# Patient Record
Sex: Female | Born: 1950 | Race: White | Hispanic: No | Marital: Married | Smoking: Former smoker
Health system: Southern US, Community
[De-identification: ages and names within clinical notes are randomized; demographics above are authoritative.]

## PROBLEM LIST (undated history)

## (undated) DIAGNOSIS — E7800 Pure hypercholesterolemia, unspecified: Secondary | ICD-10-CM

## (undated) DIAGNOSIS — E8881 Metabolic syndrome: Secondary | ICD-10-CM

## (undated) DIAGNOSIS — M2140 Flat foot [pes planus] (acquired), unspecified foot: Secondary | ICD-10-CM

## (undated) DIAGNOSIS — R739 Hyperglycemia, unspecified: Secondary | ICD-10-CM

## (undated) DIAGNOSIS — M25569 Pain in unspecified knee: Secondary | ICD-10-CM

## (undated) DIAGNOSIS — M1990 Unspecified osteoarthritis, unspecified site: Secondary | ICD-10-CM

## (undated) DIAGNOSIS — S83249A Other tear of medial meniscus, current injury, unspecified knee, initial encounter: Secondary | ICD-10-CM

## (undated) DIAGNOSIS — I341 Nonrheumatic mitral (valve) prolapse: Secondary | ICD-10-CM

## (undated) DIAGNOSIS — S83419A Sprain of medial collateral ligament of unspecified knee, initial encounter: Secondary | ICD-10-CM

## (undated) DIAGNOSIS — M2170 Unequal limb length (acquired), unspecified site: Secondary | ICD-10-CM

## (undated) DIAGNOSIS — T7840XA Allergy, unspecified, initial encounter: Secondary | ICD-10-CM

## (undated) DIAGNOSIS — E785 Hyperlipidemia, unspecified: Secondary | ICD-10-CM

## (undated) HISTORY — PX: NASAL SEPTUM SURGERY: SHX37

## (undated) HISTORY — DX: Hyperglycemia, unspecified: R73.9

## (undated) HISTORY — DX: Nonrheumatic mitral (valve) prolapse: I34.1

## (undated) HISTORY — PX: BREAST EXCISIONAL BIOPSY: SUR124

## (undated) HISTORY — DX: Pain in unspecified knee: M25.569

## (undated) HISTORY — DX: Pure hypercholesterolemia, unspecified: E78.00

## (undated) HISTORY — DX: Unequal limb length (acquired), unspecified site: M21.70

## (undated) HISTORY — DX: Other tear of medial meniscus, current injury, unspecified knee, initial encounter: S83.249A

## (undated) HISTORY — DX: Flat foot (pes planus) (acquired), unspecified foot: M21.40

## (undated) HISTORY — DX: Unspecified osteoarthritis, unspecified site: M19.90

## (undated) HISTORY — DX: Metabolic syndrome: E88.810

## (undated) HISTORY — DX: Allergy, unspecified, initial encounter: T78.40XA

## (undated) HISTORY — DX: Sprain of medial collateral ligament of unspecified knee, initial encounter: S83.419A

## (undated) HISTORY — PX: SINOSCOPY: SHX187

## (undated) HISTORY — DX: Hyperlipidemia, unspecified: E78.5

## (undated) HISTORY — DX: Metabolic syndrome: E88.81

---

## 1981-01-09 HISTORY — PX: BREAST BIOPSY: SHX20

## 1997-09-22 ENCOUNTER — Ambulatory Visit (HOSPITAL_COMMUNITY): Admission: RE | Admit: 1997-09-22 | Discharge: 1997-09-22 | Payer: Self-pay | Admitting: *Deleted

## 1998-04-22 ENCOUNTER — Other Ambulatory Visit: Admission: RE | Admit: 1998-04-22 | Discharge: 1998-04-22 | Payer: Self-pay | Admitting: *Deleted

## 1998-10-07 ENCOUNTER — Ambulatory Visit (HOSPITAL_COMMUNITY): Admission: RE | Admit: 1998-10-07 | Discharge: 1998-10-07 | Payer: Self-pay | Admitting: *Deleted

## 1999-04-26 ENCOUNTER — Other Ambulatory Visit: Admission: RE | Admit: 1999-04-26 | Discharge: 1999-04-26 | Payer: Self-pay | Admitting: *Deleted

## 1999-09-07 ENCOUNTER — Other Ambulatory Visit: Admission: RE | Admit: 1999-09-07 | Discharge: 1999-09-07 | Payer: Self-pay | Admitting: Obstetrics and Gynecology

## 1999-09-07 ENCOUNTER — Encounter (INDEPENDENT_AMBULATORY_CARE_PROVIDER_SITE_OTHER): Payer: Self-pay

## 1999-10-10 ENCOUNTER — Ambulatory Visit (HOSPITAL_COMMUNITY): Admission: RE | Admit: 1999-10-10 | Discharge: 1999-10-10 | Payer: Self-pay | Admitting: *Deleted

## 2000-08-10 ENCOUNTER — Encounter: Admission: RE | Admit: 2000-08-10 | Discharge: 2000-08-10 | Payer: Self-pay | Admitting: Obstetrics and Gynecology

## 2000-08-10 ENCOUNTER — Encounter: Payer: Self-pay | Admitting: Obstetrics and Gynecology

## 2000-12-03 ENCOUNTER — Ambulatory Visit (HOSPITAL_COMMUNITY): Admission: RE | Admit: 2000-12-03 | Discharge: 2000-12-03 | Payer: Self-pay | Admitting: Obstetrics and Gynecology

## 2000-12-03 ENCOUNTER — Encounter: Payer: Self-pay | Admitting: Obstetrics and Gynecology

## 2001-10-22 ENCOUNTER — Other Ambulatory Visit: Admission: RE | Admit: 2001-10-22 | Discharge: 2001-10-22 | Payer: Self-pay | Admitting: Obstetrics and Gynecology

## 2001-12-10 ENCOUNTER — Encounter: Payer: Self-pay | Admitting: Obstetrics and Gynecology

## 2001-12-10 ENCOUNTER — Encounter: Admission: RE | Admit: 2001-12-10 | Discharge: 2001-12-10 | Payer: Self-pay | Admitting: Obstetrics and Gynecology

## 2002-10-31 ENCOUNTER — Encounter: Payer: Self-pay | Admitting: Obstetrics and Gynecology

## 2002-10-31 ENCOUNTER — Encounter: Admission: RE | Admit: 2002-10-31 | Discharge: 2002-10-31 | Payer: Self-pay | Admitting: Obstetrics and Gynecology

## 2002-12-26 ENCOUNTER — Other Ambulatory Visit: Admission: RE | Admit: 2002-12-26 | Discharge: 2002-12-26 | Payer: Self-pay | Admitting: Family Medicine

## 2003-01-15 ENCOUNTER — Encounter: Admission: RE | Admit: 2003-01-15 | Discharge: 2003-01-15 | Payer: Self-pay | Admitting: Obstetrics and Gynecology

## 2003-05-06 ENCOUNTER — Encounter: Admission: RE | Admit: 2003-05-06 | Discharge: 2003-05-06 | Payer: Self-pay | Admitting: Family Medicine

## 2003-07-14 ENCOUNTER — Encounter: Admission: RE | Admit: 2003-07-14 | Discharge: 2003-07-14 | Payer: Self-pay | Admitting: Family Medicine

## 2004-02-17 ENCOUNTER — Encounter: Admission: RE | Admit: 2004-02-17 | Discharge: 2004-02-17 | Payer: Self-pay | Admitting: *Deleted

## 2004-11-24 ENCOUNTER — Encounter: Admission: RE | Admit: 2004-11-24 | Discharge: 2004-11-24 | Payer: Self-pay | Admitting: Family Medicine

## 2004-12-13 ENCOUNTER — Encounter: Admission: RE | Admit: 2004-12-13 | Discharge: 2004-12-13 | Payer: Self-pay | Admitting: Family Medicine

## 2005-01-26 ENCOUNTER — Other Ambulatory Visit: Admission: RE | Admit: 2005-01-26 | Discharge: 2005-01-26 | Payer: Self-pay | Admitting: Family Medicine

## 2005-10-17 ENCOUNTER — Encounter: Admission: RE | Admit: 2005-10-17 | Discharge: 2005-10-17 | Payer: Self-pay | Admitting: Family Medicine

## 2006-02-14 ENCOUNTER — Ambulatory Visit: Payer: Self-pay | Admitting: Sports Medicine

## 2006-02-22 ENCOUNTER — Encounter: Admission: RE | Admit: 2006-02-22 | Discharge: 2006-02-22 | Payer: Self-pay | Admitting: Family Medicine

## 2006-05-11 ENCOUNTER — Ambulatory Visit: Payer: Self-pay | Admitting: Sports Medicine

## 2006-05-11 DIAGNOSIS — S83419A Sprain of medial collateral ligament of unspecified knee, initial encounter: Secondary | ICD-10-CM | POA: Insufficient documentation

## 2006-08-15 ENCOUNTER — Encounter: Payer: Self-pay | Admitting: Sports Medicine

## 2006-10-17 ENCOUNTER — Encounter: Admission: RE | Admit: 2006-10-17 | Discharge: 2006-10-17 | Payer: Self-pay | Admitting: Family Medicine

## 2006-10-23 ENCOUNTER — Encounter: Admission: RE | Admit: 2006-10-23 | Discharge: 2006-10-23 | Payer: Self-pay | Admitting: Family Medicine

## 2007-03-26 ENCOUNTER — Encounter: Admission: RE | Admit: 2007-03-26 | Discharge: 2007-03-26 | Payer: Self-pay | Admitting: Family Medicine

## 2007-06-12 ENCOUNTER — Encounter: Admission: RE | Admit: 2007-06-12 | Discharge: 2007-06-12 | Payer: Self-pay | Admitting: Family Medicine

## 2007-07-09 ENCOUNTER — Encounter: Admission: RE | Admit: 2007-07-09 | Discharge: 2007-07-09 | Payer: Self-pay | Admitting: Family Medicine

## 2007-10-18 ENCOUNTER — Ambulatory Visit: Payer: Self-pay | Admitting: Sports Medicine

## 2007-10-18 DIAGNOSIS — IMO0002 Reserved for concepts with insufficient information to code with codable children: Secondary | ICD-10-CM | POA: Insufficient documentation

## 2007-11-05 ENCOUNTER — Ambulatory Visit: Payer: Self-pay | Admitting: Sports Medicine

## 2007-11-05 DIAGNOSIS — M2170 Unequal limb length (acquired), unspecified site: Secondary | ICD-10-CM | POA: Insufficient documentation

## 2007-12-03 ENCOUNTER — Ambulatory Visit: Payer: Self-pay | Admitting: Sports Medicine

## 2008-03-18 ENCOUNTER — Ambulatory Visit: Payer: Self-pay | Admitting: Family Medicine

## 2008-03-18 DIAGNOSIS — M79671 Pain in right foot: Secondary | ICD-10-CM | POA: Insufficient documentation

## 2008-03-23 ENCOUNTER — Ambulatory Visit: Payer: Self-pay | Admitting: Family Medicine

## 2008-07-06 ENCOUNTER — Ambulatory Visit: Payer: Self-pay | Admitting: Sports Medicine

## 2008-07-06 DIAGNOSIS — M25569 Pain in unspecified knee: Secondary | ICD-10-CM | POA: Insufficient documentation

## 2008-07-21 ENCOUNTER — Encounter: Admission: RE | Admit: 2008-07-21 | Discharge: 2008-07-21 | Payer: Self-pay | Admitting: Family Medicine

## 2008-07-24 ENCOUNTER — Encounter: Admission: RE | Admit: 2008-07-24 | Discharge: 2008-07-24 | Payer: Self-pay | Admitting: Family Medicine

## 2009-07-13 ENCOUNTER — Ambulatory Visit: Payer: Self-pay | Admitting: Family Medicine

## 2009-07-13 DIAGNOSIS — R7309 Other abnormal glucose: Secondary | ICD-10-CM | POA: Insufficient documentation

## 2009-08-16 ENCOUNTER — Ambulatory Visit: Payer: Self-pay | Admitting: Family Medicine

## 2009-08-17 DIAGNOSIS — E8881 Metabolic syndrome: Secondary | ICD-10-CM | POA: Insufficient documentation

## 2009-10-22 ENCOUNTER — Encounter: Admission: RE | Admit: 2009-10-22 | Discharge: 2009-10-22 | Payer: Self-pay | Admitting: Allergy and Immunology

## 2009-11-16 ENCOUNTER — Encounter: Admission: RE | Admit: 2009-11-16 | Discharge: 2009-11-16 | Payer: Self-pay | Admitting: Family Medicine

## 2010-01-30 ENCOUNTER — Encounter: Payer: Self-pay | Admitting: Family Medicine

## 2010-02-08 NOTE — Assessment & Plan Note (Signed)
Summary: F/U per Gerilyn Pilgrim / [facility]   Vital Signs:  Patient profile:   60 year old female Height:      63 inches Weight:      110.6 pounds BMI:     19.66  Vitals Entered By: Wyona Almas PHD (August 16, 2009 4:40 PM)  History of Present Illness: Assessment:  Spent 30 min  with pt.  Arline Asp has been surprised to see the amount of Na in her foods, and she has made changes accordingly.  She has also reduced her wine intake to  ~3-4 glasses of per week.  Although she has made much more careful food choices, she has recently been working on a stressful project at work, and has been craving sweets.  We discussed dealing with food cravings, and the need to plan ahead for meals/snacks to prevent getting over-hungry.  She is off to an excellent start.    Nutrition Diagnosis:  Progress on excessive mineral intake (sodium) (NI-55.2) related to restaurant foods as evidenced by self-report of marked reduction and of less frequent eating out.  Some progress on inappropriate intake of types of carbohydrate (NI-53.3) related to insufficient veg intake & wine as evidenced by self-report of reduced wine intake by  ~2/3 and increased frequency of veg's.    Intervention: See Patient Instructions.    Monitoring/Eval:  Per patient and Dr. Clelia Croft.      Other Orders: Reassessment Each 15 min unit- [facility] [phone])  Patient Instructions: 1)  3 Ds of dealing with a food craving (assuming you are not hungry):  2)  Delay, Distract, Distance; then Determine WHAT LED TO THIS CRAVING? AND WHAT ARE MY OPTIONS NOW?; and Determine your action plan now.   3)  PLAN AHEAD to avoid getting over-hungry.   4)  Carb's recommendation:  2 starch servings per meal and 1 per snack.  This assumes you are also getting protein and veg's with each lunch & dinner, and that you are getting a source of protein with your snack.   5)  A starch serving = 15 grams = 1 slice bread, 1/2 c of most scoopable starches, 1/2 c rice, 1 usual portion of most fruit  (1/2 of banana).   6)  Google diabetic exchange system for more info on portion sizes.

## 2010-02-08 NOTE — Assessment & Plan Note (Signed)
Summary: FU ORTHOTICS/JW   History of Present Illness: right orthotic not feeling as good and she is having some return of knee pain w walking  Physical Exam  Additional Exam:  remade right orthotic.   Impression & Recommendations:  Problem # 1:  PES PLANUS (ICD-734) remade r orthotic--no charge also gave her low back handout and knee exercises for VMO strengthening  Other Orders: No Charge Patient Arrived (NCPA0) (NCPA0)

## 2010-02-08 NOTE — Assessment & Plan Note (Signed)
Summary: 4PM APPT/FU MENISCUS AND ORTHOTICS/NM   Vital Signs:  Patient profile:   60 year old female Pulse rate:   91 / minute BP sitting:   140 / 80  Vitals Entered By: Lillia Pauls CMA (March 18, 2008 4:14 PM)   History of Present Illness: f/u left meniscus tear managed conservatively. Doing much better. walking without pain, cycling without pain. has run 2 minutes at a time on treadmill and only an occasional fleeting sharp pain, not the gravelly sensation she had before.  Sheis noticing some continued swelling above the knee but not in joint. Joint still feels a little stiff when she tries to stretch quad. No clicking, catching, locking or giving way.  Also noticed some new right  knee pain taht is occasional, laterally placed, lasts seconds, more of a twinge--cannot tell exactly what brings it on--infrequent  Also new right low back stiffness and achy type pain, esp after walking on treadmill--2.3/10, improves w rest. no radiation. points to psis area for location.  Physical Exam  General:  alert and well-developed.   Msk:  right psis  is tender to palpation and this reproduces her pain. some slight stiffness with flexion at hips and left rotation of trunk, reproducing her pain Additional Exam:  Patient had custom molded pohotics made using a neutral subtalar foot position. The orthotic was then placed on blue------ base abd trimmed accordingly. NOTE: the right oorthotic was problematic and did not have a great three point heel position--we discussed. As it was late in teh day she opted to take it home and try it---I will be happy to remake in next few days f this is uncomfortable.    Foot/Ankle Exam  Foot Exam:    GAIT reveals normal swing pphase with right foot turned outward on plant about 25 degrees.  Some over pronation of right foot. Left foot is more neutral inplat phase. FEET mild loss of transverse arch B, mild pes planus   Knee Exam  Knee Exam:    Right:  Inspection:  Abnormal    Left:    Inspection:  Abnormal    Palpation:  Normal    Stability:  stable    Swelling:  very slight fluid collection in suprapatellar puch, no true joint effusion is seen.    Erythema:  no    she can flex at knee to within 20 degrees of right leg rom. McMurray negative. Ligamentously intact   Impression & Recommendations:  Problem # 1:  MEDIAL MENISCUS TEAR, LEFT (ICD-836.0) Assessment Improved gradual return to exercise  Problem # 2:  PES PLANUS (ICD-734) orthotics 40 minutes face to face time  Appended Document: 4PM APPT/FU MENISCUS AND ORTHOTICS/NM    Clinical Lists Changes  Orders: Added new Service order of Est. Patient Level IV (14782) - Signed

## 2010-02-08 NOTE — Consult Note (Signed)
Summary: [facility]  [facility]   Imported By: Bradly Bienenstock 08/15/2006 14:31:36  _____________________________________________________________________  External Attachment:    Type:   Image     Comment:   External Document  Appended Document: [facility] Note ECHO pending.  question of MVP and should rule out LVH and HTN

## 2010-02-08 NOTE — Assessment & Plan Note (Signed)
Summary: R KNEE PAIN/NM   Vital Signs:  Patient profile:   60 year old female Height:      63 inches Weight:      110 pounds BMI:     19.56 BP sitting:   130 / 78  Vitals Entered By: Lillia Pauls CMA (July 06, 2008 10:34 AM)  History of Present Illness: Rt knee pain did get benefit from devils claw which lessened her pain a lot on left now yoga moves give "electric shock" deep in knee fleeting pain no swelling or giving way feels nodule on Rt hip area  left knee august last year hurt left knee meniscus tear found on left and this settled down with conservative care never locked no real swelling runs more now with less pain doing 1 to 1.5 miles and total of 5 in a week cycling for xtrain  Physical Exam  General:  Well-developed,well-nourished,in no acute distress; alert,appropriate and cooperative throughout examination Msk:  small nodule over RT SIJ excelletn hip ROM  RT and LT knee exam shows no effusion; stable ligaments; negative Mcmurray's and provocative meniscal tests; non painful patellar compression; patellar and quadriceps tendons unremarkable.  completely norm knee flexion pinch without pain bilat  RT leg is 1 cm shorter Extremities:  gait - walking is neutral running is neurtral and shows good form with orthotics in place no sign of leg length diff on gait   Impression & Recommendations:  Problem # 1:  KNEE PAIN, RIGHT (ICD-719.46) this does not seem like a significant injury I think she gets some radiation from RT SIJ will give her more hip rotation and dynamic hip stabilization exercises as I think this is a radiating pain  Problem # 2:  MEDIAL MENISCUS TEAR, LEFT (ICD-836.0) this is now asymptomatic  Problem # 3:  UNEQUAL LEG LENGTH (ICD-736.81) keep up use of orthotics they have given excellent gait correction  Patient Instructions: 1)  standing hip rotation 2)  cone touches 3)  keep up yoga 4)  gradually ease your running up as you wish  with goal to get to 3 to 4 miles for long run before women's only

## 2010-02-08 NOTE — Assessment & Plan Note (Signed)
Summary: LF KNEE PAIN/[facility]    Chief Complaint:  left knee pain.  History of Present Illness: left knee pain that was mild over the summer such that she was able to run throught the pain, but it worsened after an 8 mile run in early September.  At that time she had severe swelling and has developed popping and catching.  It is still very swollen.  She has decreased ROM.  TENS helps some.  She has been unable to run since the event.        Physical Exam  General:     Well-developed,well-nourished,in no acute distress; alert,appropriate and cooperative throughout examination Msk:     Knee: Inspection demonstrates a significant effusion.  No erythema. Palpation Demonstrates Medial > lateral joint line tenderness ROM normal in flexion and extension and lower leg rotation. Ligaments with solid consistent endpoints including ACL, PCL, LCL, MCL. Positive Mcmurray's with severe pain and click. Non painful patellar compression. Patellar and quadriceps tendons unremarkable. Hamstring and quadriceps strength are mildly decreased.   Ultrasound of left knee demonstrated evidence of meniscal tear with meniscal cyst and cleavage seen. Pulses:     2+ DPs Neurologic:     sensation normal distally Skin:     Intact without suspicious lesions or rashes Psych:     Cognition and judgment appear intact. Alert and cooperative with normal attention span and concentration.     Impression & Recommendations:  Problem # 1:  MEDIAL MENISCUS TEAR, LEFT (ICD-836.0) Assessment: New May be degenerative in nature, but she seems to be having mechanical symptoms and severe swelling.  Also its association with a longer run is favorable to degenerative tear even though no specific injury.  We will treat conservatively for now with straight leg raises and an ACE wrap.  She will return in 3-4 weeks.  She may need surgical referral is symptoms (especially mechanical) persist. May cross train on bike as  tolerated or try water exercises. Also advised to use a cryo cuff. three times a day  evaluation time 35 mins    ]

## 2010-02-08 NOTE — Assessment & Plan Note (Signed)
Summary: diabetes,tcb   Vital Signs:  Patient profile:   60 year old female Height:      63 inches Weight:      112.9 pounds BMI:     20.07  Vitals Entered By: Wyona Almas PHD (July 13, 2009 3:43 PM)  History of Present Illness: Assessment:  Spent 60 minutes face-to-face time with pt, who was seen by Dr. Gerilyn Pilgrim and myself.  Ms. Dearcos was referred by Dr. Philipp Deputy for pre-DM, HTN, & HLD.  Usual eating pattern includes 3 meals & 2 snacks/day.  Everyday foods/beverages include  (sometimes unsalted) nuts, herb/green tea, 1-2 c coffee w/ 2% milk & Splenda, 1-2 glasses wine, soy products, and fish (almost daily).  She travels every other week, so eats out during those times.  Is now trying to limit carb's and sugar.  Supplements include MVM, [place], astralagus, L-carnitine, 12-1498 mg Ca, niacin, 200 IU vit D, & occasional antacids & ibuprofen.  Usual exercise routine includes 10 mi running (2-3 runs/wk) and  ~1-2 hr cycling on wkends + 30 min spin 1-4 X wk.  24-hr recall suggests intake of  ~2100 kcal: B (8 AM)- almonds, 2 pc string cheese, coffee w/  ~1/4 c 2% milk; Snk (9 AM)- 3 slc [place] bacon, coffee w/  ~1/4 c 2% milk; L (1 PM)- [employer] 5-6 chx strips, unswt tea; Snk (3 PM)- almonds, unswt tea; D (7 PM)- white wine spritzer, 1 c low-carb pasta, 6 oz smkd salmon w/ artichoke, onion, dill, toss salad w/ l-f dressing; Snk (PM)- hot tea w/ 1/4 w/ 2% milk.    Nutrition Diagnosis:  Excessive mineral intake (sodium) (NI-55.2) related to restaurant foods as evidenced by yesterday's intake of several significant Na sources and diet history revealing frequent restaurant foods.  Inappropriate intake of types of carbohydrate (NI-53.3) related to insufficient veg intake & wine as evidenced by both diet history & 24-hr recall evidence of daily intake of 1-2 glasses of wine & veg's only 1 X day.    Intervention: See Patient Instructions.    Monitoring/Eval:  Dietary intake, body weight, and exercise at  2-3-wk F/U.      Impression & Recommendations:  Problem # 1:  HYPERGLYCEMIA (ICD-790.29) Spent 60 minutes with patient along with Dr. Wyona Almas counseling patient on nutrition for elevated blood sugars.   Orders: [facility]- Est  Level 4 (16109)  Patient Instructions: 1)  Request most recent labs to be faxed to Dr. Gerilyn Pilgrim:  [phone].   2)  Limit sodium intake to 1500 mg/day.  (Buy your UNsalted nuts at [facility]   3)  Increase potassium intake by eating more fruits and vegetables.   4)  Consider:  Limit alcohol intake to 1 glass 3-4 X wk.   5)  Obtain twice as many veg's as protein or carbohydrate foods for both lunch and dinner.  6)  When choosing starchy foods, loook for FIBER.   7)  The best fat choices will be canola or olive oil (monounsaturated).   8)  PLANNING IS THE KEY TO MAKING GOOD CHOICES.   9)  Make a list of 7-10 meals that meet your nutritional needs, taste good, and are quick and easy.  Use as a basis for shopping.   10)  Recommended reading:  Authors Casimiro Needle Pollen & Augustina Mood.

## 2010-02-08 NOTE — Assessment & Plan Note (Signed)
Summary: FU MENISCAL TEAR/NM   Vital Signs:  Patient Profile:   60 Years Old Female Pulse rate:   79 / minute BP sitting:   146 / 96  Vitals Entered By: Lillia Pauls CMA (December 03, 2007 1:48 PM)                 Chief Complaint:  FU L MENISCAL TEAR.  History of Present Illness: Hx of likely L knee medial meniscal tear since august, currently managed conservatively. Since last visit she notes significant improvement in her pain and swelling. She is able to walk without a limp.  She has been going to PT and working on strengthening exercises, which she is now able to do at home. She does note pain at night on days that she really pushes it. She also has noted significant improvement since starting devil's claw.  Currently doing spin class for exercise.  She denies any locking, catching, or instability.        Physical Exam  General:     Well-developed,well-nourished,in no acute distress; alert,appropriate and cooperative throughout examination Msk:     L Knee: Inspection demonstrates a trace suprapatellar effusion with NO baker's cyst.  No erythema. Palpation Demonstrates Medial > lateral joint line tenderness, improved from last visit ROM with 3-130 on L, 0-135 on R Ligaments with solid consistent endpoints including ACL, PCL, LCL, MCL.  Non painful patellar compression. Patellar and quadriceps tendons unremarkable. Hamstring and quadriceps strength are mildly decreased with minimal quadriceps atrophy.   R knee with full and painless, ROM. No swelling, weakness, instability.    Painless gait with walking and light jog Pulses:     2+ dp pulses Neurologic:     distal sensation intact    Impression & Recommendations:  Problem # 1:  MEDIAL MENISCUS TEAR, LEFT (ICD-836.0) making marked improvement start increasing training as noted in instructions if all progress tolerated will try to get back to running in 4 to 8 wks reck when she feels this is  close   Patient Instructions: 1)  begin eliptical training 2)  gradually increase walking up to 2-3 miles at a time 3)  if doing ok after 2-3 weeks, gradually begin running (1 minute walk, 1 minute run) x 2 weeks on treadmill every other day 4)  if ok and with full knee extension, will consider making adjustments to inserts and starting more aggressive running 5)  you can discontinue physical therapy 6)  ice after exercising 7)  use pain as your guide for activity 8)  f/u in 4-6 weeks   ]

## 2010-02-08 NOTE — Assessment & Plan Note (Signed)
Summary: POSTERIOR KNEE PAIN/NM   Vital Signs:  Patient Profile:   60 Years Old Female Pulse rate:   91 / minute BP sitting:   140 / 80  Vitals Entered By: Lillia Pauls CMA (November 05, 2007 11:24 AM)                 Chief Complaint:  L POST KNEE PAIN.  History of Present Illness: left knee pain that was mild over the summer such that she was able to run throught the pain, but it worsened after an 8 mile run in early September.  At that time she had severe swelling and has developed popping and catching.  Evaluated earlier this month and felt to have medial meniscal tear.  Since this visit she has noted continued posterior/medial knee pain and swelling.  Pain going up and down steps, pain with pivoting.  Occasional catching. No locking.  Ibuprofen helps mildly. She started glucosamine two days ago which has been helping.  She has avoided running and is doing some flat surface cycling. She is developing some lateral R knee pain due to favoring her L knee.  Also notes hx of leg length discrepancy, ? if this contributed to her injury.        Physical Exam  General:     Well-developed,well-nourished,in no acute distress; alert,appropriate and cooperative throughout examination Msk:     L Knee: Inspection demonstrates a trace to 1+ effusion with + baker's cyst.  No erythema. Palpation Demonstrates Medial > lateral joint line tenderness ROM with 0-120 on L, 0-135 on R Ligaments with solid consistent endpoints including ACL, PCL, LCL, MCL. R with positive Mcmurray's with severe pain and click. + bounce test. Non painful patellar compression. Patellar and quadriceps tendons unremarkable. Hamstring and quadriceps strength are mildly decreased.   R knee with full and painless, ROM. No swelling, weakness, instability.  Pain localized over ITB, but no tenderness to palpationno joint tenderness.    L leg 1 cm longer than right. full hip rom bilaterally. negative log roll  bilaterally Pulses:     2+ dp pulses Neurologic:     distal sensory and motor function intact.    Impression & Recommendations:  Problem # 1:  MEDIAL MENISCUS TEAR, LEFT (ICD-836.0) Assessment: Unchanged Discussed treatment options including conservative care vs surgical options, and likelihood of improvement with surgery (50%).  She would like to continue conservative measures at this point. Continue quad strengthening exercises. Ice as needed for pain.  Will refer to PT for quad strengthening and E-stim. She will f/u in 4-6 weeks  Problem # 2:  UNEQUAL LEG LENGTH (ICD-736.81) Assessment: New Will likely need correction with temporary inserts and/or custom orthotics, but will wait until knee pain and inflammation from meniscal tear has resolved before making any adjustments.    ]

## 2010-02-08 NOTE — Assessment & Plan Note (Signed)
Summary: ultrasound/ts   Vital Signs:  Patient Profile:   60 Years Old Female  Pt. in pain?   yes  Vitals Entered By: Arlyss Repress CMA, (May 11, 2006 2:55 PM)                Chief Complaint:  ultrasound per dr.fields.  History of Present Illness: Patient with history of nearly falling in tub and stretching rt knee into valgus position.  had earlier seen me for PFS and had done well.  since this fall she has had sharp pain along medical joint line of rt knee.  no more of the PFS pain.  knee feels less stable but no locking or giving way.  slight swelling after injury and after she did 4 to 5 mile run.  more pain in high heels or walking than with running.        Physical Exam  General:     Well-developed,well-nourished,in no acute distress; alert,appropriate and cooperative throughout examination Head:     normocephalic.   Msk:     knee exam shows no effusion; stable ligaments except pain on MCL testing that is mild  TTP along medial joint line    negative Mcmurray's and provocative meniscal tests;  non painful patellar compression;  patellar and quadriceps tendons unremarkable  Extremities:     Screening US exam did not show unstable meniscus but did show some increase motion of MCL on Rt    Impression & Recommendations:  Problem # 1:  M C L SPRAIN (ICD-844.1) Assessment: New Rt knee - use donjoy knee sleeve for activity and can try running in this if no worsening keep stress low cont rehab exercises cont temp orthotics Orders: [facility]- Est Level  3 (52841) Knee support pat cutout- [facility] (L2440)  re check 3 to 6 weeks pending response PRN

## 2010-03-14 DIAGNOSIS — J3089 Other allergic rhinitis: Secondary | ICD-10-CM | POA: Insufficient documentation

## 2010-03-14 DIAGNOSIS — E7800 Pure hypercholesterolemia, unspecified: Secondary | ICD-10-CM | POA: Insufficient documentation

## 2010-03-14 DIAGNOSIS — I059 Rheumatic mitral valve disease, unspecified: Secondary | ICD-10-CM | POA: Insufficient documentation

## 2010-03-15 ENCOUNTER — Institutional Professional Consult (permissible substitution): Payer: Self-pay | Admitting: Internal Medicine

## 2010-03-18 ENCOUNTER — Other Ambulatory Visit: Payer: Self-pay | Admitting: Pulmonary Disease

## 2010-03-18 ENCOUNTER — Telehealth (INDEPENDENT_AMBULATORY_CARE_PROVIDER_SITE_OTHER): Payer: Self-pay | Admitting: *Deleted

## 2010-03-18 ENCOUNTER — Institutional Professional Consult (permissible substitution) (INDEPENDENT_AMBULATORY_CARE_PROVIDER_SITE_OTHER): Payer: BC Managed Care – PPO | Admitting: Pulmonary Disease

## 2010-03-18 ENCOUNTER — Ambulatory Visit (INDEPENDENT_AMBULATORY_CARE_PROVIDER_SITE_OTHER)
Admission: RE | Admit: 2010-03-18 | Discharge: 2010-03-18 | Disposition: A | Discharge status: Home / Self Care | Payer: BC Managed Care – PPO | Source: Ambulatory Visit | Attending: Pulmonary Disease | Admitting: Pulmonary Disease

## 2010-03-18 ENCOUNTER — Encounter: Payer: Self-pay | Admitting: Pulmonary Disease

## 2010-03-18 DIAGNOSIS — J329 Chronic sinusitis, unspecified: Secondary | ICD-10-CM

## 2010-03-18 DIAGNOSIS — J019 Acute sinusitis, unspecified: Secondary | ICD-10-CM

## 2010-03-18 DIAGNOSIS — R059 Cough, unspecified: Secondary | ICD-10-CM

## 2010-03-18 DIAGNOSIS — E785 Hyperlipidemia, unspecified: Secondary | ICD-10-CM | POA: Insufficient documentation

## 2010-03-18 DIAGNOSIS — R05 Cough: Secondary | ICD-10-CM

## 2010-03-22 NOTE — Progress Notes (Signed)
Summary: pt wanted to know it we had ct results from today  Phone Note Call from Patient   Caller: Patient Call For: Clance Summary of Call: Patient phoned stated that she saw Dr Shelle Iron this morning and he sent her for a CT of her sinus and she was going out of town this weekend and wanted to check and see if we had the results yet. She can be reached at [phone] Initial call taken by: Vedia Coffer,  March 18, 2010 4:57 PM  Follow-up for Phone Call        pt was seen by [facility] this am and had CT of sinuses done.  Please advise of results.  results unsigned.  Aundra Millet Reynolds LPN  March 17, 9145 5:09 PM   Additional Follow-up for Phone Call Additional follow up Details #1::        let her know that she has sinusitis on ct with both acute and chronic components.  will call in abx, and stay on reflux meds as directed for next 3 weeks need to see her back in 4 weeks  call in augmentin 875mg  two times a day for 21 days #42,no fills continue neilmed sinus rinses am and pm for at least next few weeks. Additional Follow-up by: Barbaraann Share MD,  March 18, 2010 5:35 PM    Additional Follow-up for Phone Call Additional follow up Details #2::    called and spoke with pt.  informed her of ct results and kc's recs.  pt aware rx sent to pharmacy.  4 week f/u appt made for 04-13-2010 at 3:45pm.  pt verbalized understanding and denied any quesitons. Arman Filter, LPN  March 17, 8293 5:42 PM   New/Updated Medications: AUGMENTIN 875-125 MG TABS (AMOXICILLIN-POT CLAVULANATE) Take 1 tablet by mouth two times a day with food on a full stomach for 21 days Prescriptions: AUGMENTIN 875-125 MG TABS (AMOXICILLIN-POT CLAVULANATE) Take 1 tablet by mouth two times a day with food on a full stomach for 21 days  #42 x 0   Entered by:   Michel Bickers CMA   Authorized by:   Barbaraann Share MD   Signed by:   Michel Bickers CMA on 03/18/2010   Method used:   Electronically to        [facility]  (retail)       [address]       [place], [place]  [address]       Ph: [phone]       Fax: [phone]   RxID:   [id number]

## 2010-03-29 NOTE — Assessment & Plan Note (Signed)
Summary: consult for chronic cough    Visit Type:  Initial Consult Copy to:  Cam Hai Primary Provider/Referring Provider:  Cam Hai  CC:  Pulmonary Consult for cough that started approx Sept 2011.  Pt states cough is worse at night. .  History of Present Illness: the pt is a 59y/o female who I have been asked to see for chronic cough.  The cough started this past summer, and she underwent allergy testing which showed only a positive reaction to grass.  She had spirometry done which showed a decrease in her fev1 and fvc, but her ratio was totally normal.  The spirometry was also uninterpretable secondary to none of them having expiratory time of at least the required 6sec.  She had a cxr that was clear.  The pt was treated with prednisone and ultimately ICS and combo thearpy without significant benefit.  She was tried off a lot of her herbal preparations without change.  The cough is dry, and clearly worse at night when lying down.  She seldom coughs during the day.  It is not made worse with conversation.  She does have some sinus pressure and congestion, as well as postnasal drip.  She has some reflux symptoms, but not overly significant.  She denies any sob or change in her activity level currently.    Current Medications (verified): 1)  Lecithin  400mg  .... Take 1 Tablet By Mouth Once A Day 2)  Rred Yeast Rice 1200 .... Take 1 Tablet By Mouth Once A Day 3)  Flax Seed Oil 1000 Mg Caps (Flaxseed (Linseed)) .... Take 1 Tablet By Mouth Once A Day 4)  Coq10 100 Mg Caps (Coenzyme Q10) .... Take 1 Tablet By Mouth Once A Day  Allergies: 1)  ! Codeine 2)  ! Niacin 3)  ! Prednisone  Past History:  Past Medical History:  HYPERLIPIDEMIA (ICD-272.4) ALLERGIC RHINITIS (ICD-477.9) MITRAL VALVE PROLAPSE (ICD-424.0) HYPERCHOLESTEROLEMIA WITH HIGH HDL (ICD-272.0) DYSMETABOLIC SYNDROME X (ICD-277.7) HYPERGLYCEMIA (ICD-790.29) KNEE PAIN, RIGHT (ICD-719.46) PES PLANUS (ICD-734) UNEQUAL  LEG LENGTH (ICD-736.81) MEDIAL MENISCUS TEAR, LEFT (ICD-836.0) M C L SPRAIN (ICD-844.1)    Past Surgical History: Deviated septum repair  1980s L breast biopsy (benign)  1983  Family History: Reviewed history from 03/14/2010 and no changes required. Heart dz- Father (deceased age 69 with MI) Mother with HTN DM- Father Breast CA- Mother COPD- Mother rheumatism: maternal grandparent, sister  Social History: Reviewed history from 03/14/2010 and no changes required. Former smoker. started at age 59.  1 pack per week.  Quit 1986. pt is married and lives with spouse. pt has no children. pt works as a Engineer, civil (consulting).  ETOH- wine every day.  Review of Systems       The patient complains of non-productive cough, acid heartburn, nasal congestion/difficulty breathing through nose, and sneezing.  The patient denies shortness of breath with activity, shortness of breath at rest, productive cough, coughing up blood, chest pain, irregular heartbeats, indigestion, loss of appetite, weight change, abdominal pain, difficulty swallowing, sore throat, tooth/dental problems, headaches, itching, ear ache, anxiety, depression, hand/feet swelling, joint stiffness or pain, rash, change in color of mucus, and fever.    Vital Signs:  Patient profile:   60 year old female Height:      63 inches Weight:      110.38 pounds BMI:     19.62 O2 Sat:      99 % on Room air Temp:     97.4 degrees F oral Pulse rate:  121 / minute BP sitting:   118 / 78  (left arm) Cuff size:   regular  Vitals Entered By: Arman Filter LPN (March 17, 1608 9:02 AM)  O2 Flow:  Room air CC: Pulmonary Consult for cough that started approx Sept 2011.  Pt states cough is worse at night.  Comments Medications reviewed with patient Arman Filter LPN  March 18, 9602 9:02 AM    Physical Exam  General:  thin female in nad  Eyes:  PERRLA and EOMI.   Nose:  narrowed bilat, mild erythema, no purulence Mouth:  clear, no  lesions or exudates.  Neck:  no jvd, tmg, LN Lungs:  totally clear to auscultation  Heart:  rrr, no mrg Abdomen:  soft and nontender, bs+ Extremities:  no edema or cyanosis  pulses intact distally Neurologic:  alert, oriented, moves all 4    Impression & Recommendations:  Problem # 1:  COUGH (ICD-786.2) the pt's cough sounds more upper airway in origin than anything else.  I suspect this is not due to cough variant asthma, especially with her lack of response to excellent asthma therapy.  Her history is suspicious for chronic sinus disease, and possibly a component of reflux.  I would like to check a scan of her sinuses, and also treat her emperically for reflux.  If the scan shows disease, will need a prolonged course of abx.    Medications Added to Medication List This Visit: 1)  Lecithin 400mg   .... Take 1 tablet by mouth once a day 2)  Rred Yeast Rice 1200  .... Take 1 tablet by mouth once a day 3)  Flax Seed Oil 1000 Mg Caps (Flaxseed (linseed)) .... Take 1 tablet by mouth once a day 4)  Coq10 100 Mg Caps (Coenzyme q10) .... Take 1 tablet by mouth once a day 5)  Omeprazole 20 Mg Cpdr (Omeprazole) .... One by mouth in am and pm. take one half hour before eating.  Other Orders: Consultation Level V [phone]) Radiology Referral (Radiology)  Patient Instructions: 1)  will check scan of sinuses looking for chronic sinus disease 2)  trial of omeprazole 20mg  one in am and pm for next 3 weeks. 3)  stop fish oil for now 4)  will call you with results of sinus xray.  Prescriptions: OMEPRAZOLE 20 MG  CPDR (OMEPRAZOLE) one by mouth in am and pm. Take one half hour before eating.  #50 x 0   Entered and Authorized by:   Barbaraann Share MD   Signed by:   Barbaraann Share MD on 03/18/2010   Method used:   Print then Give to Patient   RxID:   [id number]

## 2010-04-01 ENCOUNTER — Telehealth: Payer: Self-pay | Admitting: Pulmonary Disease

## 2010-04-01 NOTE — Telephone Encounter (Signed)
Pt read an article that if you are on an abx for more than 5-7 days then you can develop a resistance to certain abx's. Called and spoke with pt and she states she wanted to know why she needed to continue her abx augmentin for another week since she has already been on it for 2 weeks. Per [facility] bc she has chronic sinusitis. Pt verbalized udnerstanding  Carver Fila, CMA

## 2010-04-01 NOTE — Telephone Encounter (Signed)
[email or web address].Jennifer Castillo, CMA  

## 2010-04-01 NOTE — Telephone Encounter (Signed)
LMTCB

## 2010-04-01 NOTE — Telephone Encounter (Signed)
Spoke with pt.  She is concerned about taking omeprazole for 3 wks. She read today that taking this med for more than 14 days can cause resistance to abx.  She states that med has helped and she is feeling much better so wants to know if Kristin Fleming wants her to continue to take this.  Please advise thanks

## 2010-04-01 NOTE — Telephone Encounter (Signed)
Yes, she is to continue this, and not an issue to take longterm as advertised.

## 2010-04-12 ENCOUNTER — Encounter: Payer: Self-pay | Admitting: Pulmonary Disease

## 2010-04-13 ENCOUNTER — Ambulatory Visit: Payer: BC Managed Care – PPO | Admitting: Pulmonary Disease

## 2010-04-14 ENCOUNTER — Ambulatory Visit (INDEPENDENT_AMBULATORY_CARE_PROVIDER_SITE_OTHER): Payer: BC Managed Care – PPO | Admitting: Pulmonary Disease

## 2010-04-14 ENCOUNTER — Encounter: Payer: Self-pay | Admitting: Pulmonary Disease

## 2010-04-14 VITALS — BP 140/82 | HR 82 | Temp 97.5°F | Ht 63.0 in | Wt 111.2 lb

## 2010-04-14 DIAGNOSIS — J019 Acute sinusitis, unspecified: Secondary | ICD-10-CM

## 2010-04-14 DIAGNOSIS — R059 Cough, unspecified: Secondary | ICD-10-CM

## 2010-04-14 DIAGNOSIS — R05 Cough: Secondary | ICD-10-CM

## 2010-04-14 NOTE — Progress Notes (Signed)
  Subjective:    Patient ID: Kristin Fleming, female    DOB: 07-13-1950, 60 y.o.   MRN: [id number]  HPI The pt comes in today for f/u of her chronic cough.  At the last visit, her cough was felt to be more upper airway than lower, and a ct sinuses showed acute and chronic sinusitis.  She was treated with 3 weeks of abx, neilmed sinus rinses, and emperic treatment with PPI for possible LPR.  She comes in today where her cough is at least 40-50% better, and she feels it is more of a "nuisance" now than anything else.  She still has some postnasal drip, but is trying to avoid antihistamines.  She does not feel reflux is an issue to her cough at this point.  Her sinus pressure has resolved, and has not seen purulence from nares.    Review of Systems  Constitutional: Negative for fever and unexpected weight change.  HENT: Positive for congestion, rhinorrhea, sneezing and postnasal drip. Negative for ear pain, nosebleeds, sore throat, trouble swallowing, dental problem and sinus pressure.   Eyes: Positive for redness and itching.  Respiratory: Positive for cough. Negative for chest tightness, shortness of breath and wheezing.   Cardiovascular: Negative for palpitations and leg swelling.  Gastrointestinal: Positive for diarrhea. Negative for nausea and vomiting.  Genitourinary: Negative for dysuria.  Musculoskeletal: Negative for joint swelling.  Skin: Negative for rash.  Neurological: Negative for headaches.  Hematological: Does not bruise/bleed easily.  Psychiatric/Behavioral: Negative for dysphoric mood. The patient is not nervous/anxious.        Objective:   Physical Exam Thin female in nad Nares patent without discharge No edema or cyanosis  Alert and oriented, moves all 4 without obvious deficit.        Assessment & Plan:

## 2010-04-14 NOTE — Patient Instructions (Signed)
Ok to stop omeprazole, but if cough worsens, this may be an issue Continue nasal rinses for a little while longer Can try zyrtec or chlorpheniramine for post nasal drip Can try nasonex 2 sprays each nostril once a day for 2 weeks. (samples given) followup with me as needed.  Let me know if cough worsens

## 2010-04-16 ENCOUNTER — Encounter: Payer: Self-pay | Admitting: Pulmonary Disease

## 2010-04-16 DIAGNOSIS — J019 Acute sinusitis, unspecified: Secondary | ICD-10-CM | POA: Insufficient documentation

## 2010-04-16 NOTE — Assessment & Plan Note (Addendum)
The pt's cough is at least 40-50% better since the last visit, and continues to sound upper airway in origin.  She is still having postnasal drip, but does not feel the PPI has made a signficant difference.  I have asked her to continue with her nasal hygiene regimen, and also to try otc antihistamine.  I have also given her samples of nasonex to try for her postnasal drip.  I have given her the option of conservative treatment over a little more time vs doing further w/u with ENT evaluation, spirometry, etc.  She would like to see how she does over time before doing further w/u.

## 2010-04-16 NOTE — Assessment & Plan Note (Signed)
The pt has been treated with a prolonged course of augmentin, and is much improved.  It is unclear whether this may be an ongoing issue for her.  Will follow clinically for now.

## 2010-05-09 ENCOUNTER — Encounter: Payer: Self-pay | Admitting: Pulmonary Disease

## 2010-08-12 ENCOUNTER — Ambulatory Visit: Payer: BC Managed Care – PPO | Admitting: Pulmonary Disease

## 2010-08-22 ENCOUNTER — Encounter: Payer: Self-pay | Admitting: Pulmonary Disease

## 2010-08-22 ENCOUNTER — Ambulatory Visit (INDEPENDENT_AMBULATORY_CARE_PROVIDER_SITE_OTHER): Payer: BC Managed Care – PPO | Admitting: Pulmonary Disease

## 2010-08-22 VITALS — BP 144/86 | HR 73 | Temp 97.4°F | Ht 63.0 in | Wt 113.4 lb

## 2010-08-22 DIAGNOSIS — R053 Chronic cough: Secondary | ICD-10-CM

## 2010-08-22 DIAGNOSIS — R05 Cough: Secondary | ICD-10-CM

## 2010-08-22 DIAGNOSIS — R059 Cough, unspecified: Secondary | ICD-10-CM

## 2010-08-22 NOTE — Patient Instructions (Signed)
Try Qnasal 2 puffs each nostril each am after rinsing sinuses.  Do not sniff, let inhaler do work Take zyrtec 10mg  at bedtime Do the above for the next 3 weeks, and let me know of your progress.

## 2010-08-22 NOTE — Progress Notes (Signed)
  Subjective:    Patient ID: Kristin Fleming, female    DOB: [birth year], 60 y.o.   MRN: [id number]  HPI The patient comes in today for evaluation of cough.  I had seen her in the past, and she was found to have acute and possibly chronic sinusitis by CT scan.  She was treated with antibiotics and a nasal hygiene regimen with significant improvement in her symptoms.  Her cough did not resolve 100%, but nearly so.  She is now having a slight increase in her cough and is concerned that it will escalate again.  She is worried about what is causing it and whether this may impact her long term.  She has never had a valid spirometry, but really has no history to support the diagnosis of asthma.  The plan was to do this if her cough returned.  She continues to have some nasal symptoms and postnasal drip.  She is also felt to have a mild cyclical cough mechanism as well.  She denies any overt symptoms of GERD at this time.   Review of Systems  Constitutional: Negative for fever and unexpected weight change.  HENT: Positive for congestion, rhinorrhea and postnasal drip. Negative for ear pain, nosebleeds, sore throat, sneezing, trouble swallowing, dental problem and sinus pressure.   Eyes: Positive for redness and itching.  Respiratory: Positive for cough. Negative for chest tightness, shortness of breath and wheezing.   Cardiovascular: Negative for palpitations and leg swelling.  Gastrointestinal: Negative for nausea and vomiting.  Genitourinary: Negative for dysuria.  Musculoskeletal: Negative for joint swelling.  Skin: Negative for rash.  Neurological: Negative for headaches.  Hematological: Bruises/bleeds easily.  Psychiatric/Behavioral: Negative for dysphoric mood. The patient is not nervous/anxious.        Objective:   Physical Exam Thin female in no acute distress Nose patent without discharge or purulence Oropharynx clear Chest is clear to auscultation Cardiac with regular rate and  rhythm Lower extremities without edema, no cyanosis noted Alert and oriented, moves all 4 extremities.       Assessment & Plan:

## 2010-08-23 NOTE — Assessment & Plan Note (Signed)
The patient has a history of chronic cough, but was found to have changes of acute and chronic sinusitis on her CT scan.  She was treated aggressively for this, and had significant improvement in her cough.  Her cough is now a little bit more prominent, and she is concerned about it worsening.  Her spirometry today is normal by her air flow numbers, but the flow volume loop suggest occult obstruction.  However, the patient has been treated with prednisone, inhaled corticosteroids, and a LABA/ICS prior to seeing me originally without a change in her symptoms.  Therefore, this is very unlikely to be asthma.  I really think a lot of her problems are related to rhinitis with an element of chronic sinusitis.  The patient is very hesitant to stay on any maintenance medications over a period of time, and I have told her that she is going to have to make the quality of life decision of living with her cough versus trying a maintenance regimen to see if things improve.  I would recommend a nasal corticosteroid, sinus rinses, as well as a daily antihistamine for a period of time to see if things improve.  She can then make the decision whether this agrees with her lifestyle.  If she continues to have issues with a cough despite the above, I would reimage her sinuses and refer to ENT if persistently abnormal.

## 2010-09-08 ENCOUNTER — Other Ambulatory Visit (HOSPITAL_COMMUNITY)
Admission: RE | Admit: 2010-09-08 | Discharge: 2010-09-08 | Disposition: A | Discharge status: Home / Self Care | Payer: BC Managed Care – PPO | Source: Ambulatory Visit | Attending: Family Medicine | Admitting: Family Medicine

## 2010-09-08 ENCOUNTER — Other Ambulatory Visit: Payer: Self-pay | Admitting: Family Medicine

## 2010-09-08 DIAGNOSIS — Z01419 Encounter for gynecological examination (general) (routine) without abnormal findings: Secondary | ICD-10-CM | POA: Insufficient documentation

## 2011-02-10 ENCOUNTER — Other Ambulatory Visit: Payer: Self-pay | Admitting: *Deleted

## 2011-02-10 ENCOUNTER — Other Ambulatory Visit: Payer: Self-pay | Admitting: Family Medicine

## 2011-02-10 DIAGNOSIS — Z1231 Encounter for screening mammogram for malignant neoplasm of breast: Secondary | ICD-10-CM

## 2011-02-13 ENCOUNTER — Ambulatory Visit (INDEPENDENT_AMBULATORY_CARE_PROVIDER_SITE_OTHER): Payer: BC Managed Care – PPO | Admitting: Sports Medicine

## 2011-02-13 VITALS — BP 140/80

## 2011-02-13 DIAGNOSIS — M2170 Unequal limb length (acquired), unspecified site: Secondary | ICD-10-CM

## 2011-02-13 DIAGNOSIS — M23305 Other meniscus derangements, unspecified medial meniscus, unspecified knee: Secondary | ICD-10-CM

## 2011-02-13 DIAGNOSIS — IMO0002 Reserved for concepts with insufficient information to code with codable children: Secondary | ICD-10-CM

## 2011-02-13 MED ORDER — KETOPROFEN POWD: Status: DC

## 2011-02-13 NOTE — Assessment & Plan Note (Signed)
Exam of the left knee is completely unremarkable today and she shows no signs of continued meniscus injury.

## 2011-02-13 NOTE — Patient Instructions (Addendum)
Do stretch with both knees to chest, then stretch both knees to each side   Do pretzel stretch on both sides  Standing hip rotations  Please follow up in 6 weeks for new orthotics  Thank you for seeing [place] today!

## 2011-02-13 NOTE — Assessment & Plan Note (Addendum)
Leg lengths equal today- 81 cm bilat Upward shift of right hemipelvis causes pseudo leg length discrepency Gave patient knee to chest and pretzel stretches, and standing hip rotations to help reset pelvis Adjusted patient's current orthotics- shaved down previous leg length corrections Patient to return for follow up in 6 weeks, and we will make her a new pair of orthotics at that time.   Her gait looks very neutral. This is after we adjusted the orthotics to take off the leg length correction and we repositioned her pelvis by doing some manipulations.

## 2011-02-13 NOTE — Progress Notes (Signed)
  Subjective:    Patient ID: Kristin Fleming, female    DOB: [birth year], 61 y.o.   MRN: [id number]  HPI   Pt presents to clinic for orthotics adjustment. States she went running in a new pair of running shoes without orthotics, and had less rt SI joint pain that she usually does with her orthotics. Hx of rt SI joint pain since falling down a flight of stairs and fx coccyx 8 years ago. Pt runs 10 miles per week.  Orthotics originally helped but she is wondering if she needs some different adjustment or if there is some problem with these She has some moderate forefoot collapse on the right with some early bunion changes that have been stabilized by the orthotics. She also had knee pain and a torn meniscus on the right that has now resolved.  She comes for our assessment.     Review of Systems     Objective:   Physical Exam  Legs 81 cm bilat Rt leg shortens with lying down when pelvis shifts upwards Posterior shift of PSIS on rt, sitting higher and more posterior than lt Nml hip ROM bilat Standing - rt pelvic forward rotation  Rt foot - more forefoot break- forefoot widening Slight breakdown of long arch on rt Slight hammering of 4th toe on rt Good great toe motion on rt Good post tib function  No scoliosis is noted        Assessment & Plan:

## 2011-03-09 ENCOUNTER — Ambulatory Visit
Admission: RE | Admit: 2011-03-09 | Discharge: 2011-03-09 | Disposition: A | Discharge status: Home / Self Care | Payer: BC Managed Care – PPO | Source: Ambulatory Visit | Attending: Family Medicine | Admitting: Family Medicine

## 2011-03-09 DIAGNOSIS — Z1231 Encounter for screening mammogram for malignant neoplasm of breast: Secondary | ICD-10-CM

## 2011-03-27 ENCOUNTER — Ambulatory Visit (INDEPENDENT_AMBULATORY_CARE_PROVIDER_SITE_OTHER): Payer: BC Managed Care – PPO | Admitting: Sports Medicine

## 2011-03-27 VITALS — BP 134/80

## 2011-03-27 DIAGNOSIS — M79671 Pain in right foot: Secondary | ICD-10-CM

## 2011-03-27 DIAGNOSIS — M79609 Pain in unspecified limb: Secondary | ICD-10-CM

## 2011-03-27 DIAGNOSIS — M25569 Pain in unspecified knee: Secondary | ICD-10-CM

## 2011-03-27 DIAGNOSIS — M2170 Unequal limb length (acquired), unspecified site: Secondary | ICD-10-CM

## 2011-03-27 NOTE — Assessment & Plan Note (Signed)
Correction added to RT orthotic  This does improve gait

## 2011-03-27 NOTE — Assessment & Plan Note (Signed)
With degenerative meniscus I think she needs new orthotics for better support and to unload medial aspect of knee

## 2011-03-27 NOTE — Assessment & Plan Note (Signed)
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 The orthotic was heated and afterward the patient stood on the orthotic blank positioned on the orthotic stand. The patient was positioned in subtalar neutral position and 10 degrees of ankle dorsiflexion in a weight bearing stance. After completion of molding, a stable base was applied to the orthotic blank. The blank was ground to a stable position for weight bearing. Size: 7 red cambray Base: blue eva med density Posting: RT blue foam padding 1/2 cm Additional orthotic padding:  Time 45 mins  Gait neutral and no pain on walking or running P orthotics complete

## 2011-03-27 NOTE — Progress Notes (Signed)
  Subjective:    Patient ID: Kristin Fleming, female    DOB: 1950/07/09, 61 y.o.   MRN: [id number]  HPI Patient returns for evaluation We modified her orthotics on last visit Plan to make new ones on this visit if that helped foot pain She felt less foot pain However older orthotics cause some RT knee pain with running  No swelling, giving way or locking of RT knee now   Review of Systems     Objective:   Physical Exam NAD  RT Knee: Normal to inspection with no erythema or effusion or obvious bony abnormalities. Palpation normal with no warmth or joint line tenderness or patellar tenderness or condyle tenderness. ROM normal in flexion and extension and lower leg rotation. Ligaments with solid consistent endpoints including ACL, PCL, LCL, MCL. Negative Mcmurray's and provocative meniscal tests. Non painful patellar compression. Patellar and quadriceps tendons unremarkable. Hamstring and quadriceps strength is normal.  TMT joint DJD at 2nd TMT RT and some bossing on left Widening of RT forefoot Hallux rigidus with some DJD chnages at MTP1/ less on left Abnormal calluses Standing she looses long arch and transverse arch on RT, long arch left Leg length is 1 cm less on RT       Assessment & Plan:

## 2011-05-01 ENCOUNTER — Ambulatory Visit (INDEPENDENT_AMBULATORY_CARE_PROVIDER_SITE_OTHER): Payer: BC Managed Care – PPO | Admitting: Sports Medicine

## 2011-05-01 VITALS — BP 136/79

## 2011-05-01 DIAGNOSIS — M533 Sacrococcygeal disorders, not elsewhere classified: Secondary | ICD-10-CM

## 2011-05-01 DIAGNOSIS — M2170 Unequal limb length (acquired), unspecified site: Secondary | ICD-10-CM

## 2011-05-01 NOTE — Patient Instructions (Signed)
1. You can try stretching when you have SI joint pain in your back.   The pretzel stretch is lying on your side with the top leg off of the bed.  Stretch your leg down towards the floor with an opposing stretch on your shoulder.  The coccyx stretch.  Put a small book in the mid lower back where your coccyx and try to push down on either side of your hips.  Basic pelvic tilts help also.  2. Follow up with [place] in July.  We will have you see Dr. Katrinka Blazing at that time.

## 2011-05-03 DIAGNOSIS — M533 Sacrococcygeal disorders, not elsewhere classified: Secondary | ICD-10-CM | POA: Insufficient documentation

## 2011-05-03 NOTE — Assessment & Plan Note (Signed)
She is doing well with her orthotics.  Follow up PRN

## 2011-05-03 NOTE — Progress Notes (Signed)
  Subjective:    Patient ID: Kristin Fleming, female    DOB: 03/17/1950, 61 y.o.   MRN: [id number]  HPI 61 y/o female is here for her one month follow up after getting orthotics.  The orthotics are working well for her.  She has no foot pain or blisters.  They fit well in her shoes.  The knee pain she had previously with running is resolved. She is running about 10 miles per week.    On her last visit she had some manipulation for her SI dysfunction.  She is not having any pain today but would like more manipulation.   Review of Systems     Objective:   Physical Exam  Lumbar spine has normal ROM and is non-tender SI joints are non-tender, joint mice r>l No pain with FABER but right is tighter than the left Both SI joints are mobile but left moves less than right with pelvic rocking  Pretzel stretch and pelvic rocking performed in clinic to loosen the SI joints      Assessment & Plan:

## 2011-05-03 NOTE — Assessment & Plan Note (Addendum)
Taught patient 3 exercises that she can do to self treat at home PRN.  She will follow up in July for OMT.

## 2012-04-30 ENCOUNTER — Ambulatory Visit (INDEPENDENT_AMBULATORY_CARE_PROVIDER_SITE_OTHER): Payer: 59 | Admitting: Sports Medicine

## 2012-04-30 ENCOUNTER — Encounter: Payer: Self-pay | Admitting: Sports Medicine

## 2012-04-30 VITALS — BP 160/84 | HR 101 | Ht 63.0 in | Wt 113.0 lb

## 2012-04-30 DIAGNOSIS — M7740 Metatarsalgia, unspecified foot: Secondary | ICD-10-CM | POA: Insufficient documentation

## 2012-04-30 DIAGNOSIS — M7750 Other enthesopathy of unspecified foot: Secondary | ICD-10-CM

## 2012-04-30 NOTE — Assessment & Plan Note (Signed)
I placed a metatarsal pad on the right and left orthotic Her walking gait was comfortable after these were placed  We also added these to a pair of regular shoes  She should try this for one month  If this strategy works we want to have her continue to use metatarsal pads in all shoes

## 2012-04-30 NOTE — Progress Notes (Signed)
  Subjective:    Patient ID: VERTA RIEDLINGER, female    DOB: May 22, 1950, 62 y.o.   MRN: [id number]  HPI Patient with have had an orthotics for some time. She generally has done better from the pain and from other running related injury since using the orthotics. Recently however she has had more forefoot pain. This has been primarily in the right foot. She sees a change in the shape of her foot in the orthotics are not as comfortable. She comes for evaluation of this.  This doesn't not seem to be made worse by running and she is only running about 6 miles per week.  She does notice that it is worse in certain shoes    Review of Systems     Objective:   Physical Exam No acute distress  Right foot shows increasing collapse of the transverse arch The right foot is now 3/4 cm wider than the left at the forefoot The fourth metatarsal head is drop on the right She has some early hammertoe changes on toes 4 and 5 She has no knuckle pads on toes 2 and 4 Mild loss of the longitudinal arch  Left foot reveals very mild drop of the transverse arch The first metatarsal head is somewhat down palpation non-painful       Assessment & Plan:

## 2012-05-30 ENCOUNTER — Encounter: Payer: Self-pay | Admitting: Sports Medicine

## 2012-05-30 ENCOUNTER — Ambulatory Visit (INDEPENDENT_AMBULATORY_CARE_PROVIDER_SITE_OTHER): Payer: 59 | Admitting: Sports Medicine

## 2012-05-30 VITALS — BP 162/102 | HR 99 | Ht 63.0 in | Wt 113.0 lb

## 2012-05-30 DIAGNOSIS — M7740 Metatarsalgia, unspecified foot: Secondary | ICD-10-CM

## 2012-05-30 DIAGNOSIS — M7741 Metatarsalgia, right foot: Secondary | ICD-10-CM

## 2012-05-30 DIAGNOSIS — M7742 Metatarsalgia, left foot: Secondary | ICD-10-CM

## 2012-05-30 DIAGNOSIS — M7750 Other enthesopathy of unspecified foot: Secondary | ICD-10-CM

## 2012-05-30 NOTE — Progress Notes (Signed)
  Subjective:    Patient ID: Kristin Fleming, female    DOB: Oct 31, 1950, 62 y.o.   MRN: [id number]  HPI  Pt presents to clinic for f/u of bilat metatarsalgia- R>L. She has been wearing MT pads in most pairs of shoes, and this has been very helpful. Rt great toe joint pain has much improved.  Running in custom orthotics with MT pads, without pain.  Comes to see if MT pads can be placed in regular shoes  Med Hx High cholesterol - not on meds/ HDL good  Fam HX that father died at 52 massive MI/ Mother had 4V bypass/ she continues to run but no chest pain/  However periodic BP elevation even with HCTZ 12.5 qod    Review of Systems     Objective:   Physical Exam  NAD Blood pressure 160/102 and it has been elevated on prior visits  Rt foot: 4th MT down Callusing smaller  1st and 2nd TMT bossing Small corn on DIP 2nd toe Callusing on dorsum of toes 2-4   Lt foot: Transverse arch dropped but flexible No TMT bossing Excellent great toe motion          Assessment & Plan:    Increased cardiac risk  While she is an active person she has essentially 3 risk factors of hyperlipidemia, hypertension and a positive family history of coronary artery disease in both parents. Since she runs distance and pushes herself fairly hard I think she would be smart to do an exercise tolerance test. I will schedule her for this and send a note to her physician Dr. Clelia Croft to be sure that this fits with her long-term management.

## 2012-05-30 NOTE — Assessment & Plan Note (Signed)
She was doing very well from the standpoint of controlling pain with the use metatarsal pads  We added these today she is today and that helped  She was given toe spacers for the right foot because she's getting a corn that was putting pressure on her right great toe  She was given a prescription to order more pads in place them in her other pairs of shoes

## 2012-12-10 ENCOUNTER — Encounter: Payer: Self-pay | Admitting: Family Medicine

## 2012-12-10 ENCOUNTER — Ambulatory Visit (INDEPENDENT_AMBULATORY_CARE_PROVIDER_SITE_OTHER): Payer: 59 | Admitting: Family Medicine

## 2012-12-10 VITALS — BP 159/99 | HR 87 | Ht 63.0 in | Wt 113.0 lb

## 2012-12-10 DIAGNOSIS — S93409A Sprain of unspecified ligament of unspecified ankle, initial encounter: Secondary | ICD-10-CM

## 2012-12-10 NOTE — Patient Instructions (Signed)
Thank you for coming in today  I did not see any problems with your peroneal tendon on your ultrasound I suspect this is all ligament or soft tissue injury Get an ankle brace and wear during exercise  Start ankle rehab exercises - eccentric calf raise 3 x 15 - calf raise with toes turned out 3 x 15 - single leg balance 3 x 30 sec with increasing difficulty - single leg balance clock reach. 15 reps in each direction  Ice after exercise and 2 x per day Aleve 2 tablets 2 x per day for 7 days with food Okay to start jogging. Walk 4 min and jog 1 min, then walk 3 min and jog 1 min and progressively increase
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
## 2012-12-21 ENCOUNTER — Encounter: Payer: Self-pay | Admitting: Internal Medicine

## 2013-02-10 ENCOUNTER — Encounter: Payer: Self-pay | Admitting: Cardiology

## 2013-05-27 ENCOUNTER — Encounter: Payer: Self-pay | Admitting: Internal Medicine

## 2013-05-28 ENCOUNTER — Telehealth: Payer: Self-pay | Admitting: *Deleted

## 2013-05-28 NOTE — Telephone Encounter (Signed)
Message copied by Daphine DeutscherMILLER, REGINA N on Wed May 28, 2013  3:13 PM ------      Message from: Hart CarwinBRODIE, DORA M      Created: Wed May 28, 2013  1:08 PM      Regarding: colonoscopy       Rene KocherRegina, this pt will be calling from Dr Kirtland BouchardK.Shaw's office. She will be setting up a colonoscopy.Marland Kitchen. She wants on on a Friday.You may contact her about it. ------

## 2013-05-28 NOTE — Telephone Encounter (Signed)
Left a message for patient to call me. 

## 2013-06-03 NOTE — Telephone Encounter (Signed)
Patient has scheduled procedure already.

## 2013-07-09 ENCOUNTER — Ambulatory Visit (AMBULATORY_SURGERY_CENTER): Payer: Self-pay | Admitting: *Deleted

## 2013-07-09 VITALS — Ht 63.0 in | Wt 113.2 lb

## 2013-07-09 DIAGNOSIS — Z1211 Encounter for screening for malignant neoplasm of colon: Secondary | ICD-10-CM

## 2013-07-09 MED ORDER — MOVIPREP 100 G PO SOLR: ORAL | Status: DC

## 2013-07-09 NOTE — Progress Notes (Signed)
No allergies to eggs or soy. No problems with anesthesia.  Pt given Emmi instructions for colonoscopy  No oxygen use  No diet drug use  

## 2013-07-10 ENCOUNTER — Encounter: Payer: Self-pay | Admitting: Internal Medicine

## 2013-07-15 ENCOUNTER — Telehealth: Payer: Self-pay | Admitting: Internal Medicine

## 2013-07-15 NOTE — Telephone Encounter (Signed)
Spoke with pt regarding the cost of her Moviprep. Informed pt we do not like to use a mail order pharmacy, due to the time it takes to mail the prep to the pt and if the pt will receive the prep in time prior to the procedure. She understood. She has not spoke with [employer] yet regarding the cost, but will call our office back if she may need to reschedule the colonoscopy appt or try another prep.

## 2013-07-16 ENCOUNTER — Encounter: Payer: Self-pay | Admitting: Internal Medicine

## 2013-07-23 HISTORY — PX: TOE DEBRIDEMENT: SHX1069

## 2013-07-25 ENCOUNTER — Encounter: Payer: Self-pay | Admitting: Internal Medicine

## 2013-07-25 ENCOUNTER — Ambulatory Visit (AMBULATORY_SURGERY_CENTER): Payer: 59 | Admitting: Internal Medicine

## 2013-07-25 VITALS — BP 128/84 | HR 73 | Temp 98.7°F | Resp 15 | Ht 63.0 in | Wt 113.0 lb

## 2013-07-25 DIAGNOSIS — Z1211 Encounter for screening for malignant neoplasm of colon: Secondary | ICD-10-CM

## 2013-07-25 MED ORDER — SODIUM CHLORIDE 0.9 % IV SOLN: 500.0000 mL | INTRAVENOUS | Status: DC

## 2013-07-25 NOTE — Op Note (Signed)
[facility] [address] [place], [id number]   COLONOSCOPY PROCEDURE REPORT  PATIENT: Kristin Fleming, Kristin H.  MR#: [id number] BIRTHDATE: May 03, 1950 , 63  yrs. old GENDER: Female ENDOSCOPIST: Hart Carwinora M Brodie, MD REFERRED XB:JYNWGNFAOBY:Kimberlee Shaw, M.D. PROCEDURE DATE:  07/25/2013 PROCEDURE:   Colonoscopy, screening First Screening Colonoscopy - Avg.  risk and is 50 yrs.  old or older - No.  Prior Negative Screening - Now for repeat screening. 10 or more years since last screening  History of Adenoma - Now for follow-up colonoscopy & has been > or = to 3 yrs.  N/A  Polyps Removed Today? No.  Recommend repeat exam, <10 yrs? No. ASA CLASS:   Class II INDICATIONS:Average risk patient for colon cancer and last colonoscopy January 2005 showed diverticulosis and hemorrhoids. MEDICATIONS: MAC sedation, administered by CRNA and Propofol (Diprivan) 170 mg IV  DESCRIPTION OF PROCEDURE:   After the risks benefits and alternatives of the procedure were thoroughly explained, informed consent was obtained.  A digital rectal exam revealed no abnormalities of the rectum.   The LB [id number]-H190 [id number]  endoscope was introduced through the anus and advanced to the cecum, which was identified by both the appendix and ileocecal valve. No adverse events experienced.   The quality of the prep was excellent, using MoviPrep  The instrument was then slowly withdrawn as the colon was fully examined.      COLON FINDINGS: There was moderate diverticulosis noted throughout the entire examined colon with associated angulation and muscular hypertrophy.  Retroflexed views revealed no abnormalities. The time to cecum=4 minutes 20 seconds.  Withdrawal time=6 minutes 11 seconds.  The scope was withdrawn and the procedure completed. COMPLICATIONS: There were no complications.  ENDOSCOPIC IMPRESSION: There was moderate diverticulosis noted throughout the entire examined colon  RECOMMENDATIONS: high fiber  diet Recall colonoscopy in 10 years   eSigned:  Hart Carwinora M Brodie, MD 07/25/2013 10:06 AM   cc:   PATIENT NAME:  Kristin Fleming, Kristin H. MR#: [id number]

## 2013-07-25 NOTE — Progress Notes (Signed)
A/ox3 pleased with MAC, report to Suzanne RN 

## 2013-07-25 NOTE — Patient Instructions (Addendum)
YOU HAD AN ENDOSCOPIC PROCEDURE TODAY AT [facility]: Refer to the procedure report that was given to you for any specific questions about what was found during the examination.  If the procedure report does not answer your questions, please call your gastroenterologist to clarify.  If you requested that your care partner not be given the details of your procedure findings, then the procedure report has been included in a sealed envelope for you to review at your convenience later.  YOU SHOULD EXPECT: Some feelings of bloating in the abdomen. Passage of more gas than usual.  Walking can help get rid of the air that was put into your GI tract during the procedure and reduce the bloating. If you had a lower endoscopy (such as a colonoscopy or flexible sigmoidoscopy) you may notice spotting of blood in your stool or on the toilet paper. If you underwent a bowel prep for your procedure, then you may not have a normal bowel movement for a few days.  DIET: Your first meal following the procedure should be a light meal and then it is ok to progress to your normal diet.  A half-sandwich or bowl of soup is an example of a good first meal.  Heavy or fried foods are harder to digest and may make you feel nauseous or bloated.  Likewise meals heavy in dairy and vegetables can cause extra gas to form and this can also increase the bloating.  Drink plenty of fluids but you should avoid alcoholic beverages for 24 hours.Try to increase the fiber in your diet.  ACTIVITY: Your care partner should take you home directly after the procedure.  You should plan to take it easy, moving slowly for the rest of the day.  You can resume normal activity the day after the procedure however you should NOT DRIVE or use heavy machinery for 24 hours (because of the sedation medicines used during the test).    SYMPTOMS TO REPORT IMMEDIATELY: A gastroenterologist can be reached at any hour.  During normal business hours, 8:30  AM to 5:00 PM Monday through Friday, call [phone].  After hours and on weekends, please call the GI answering service at [phone] who will take a message and have the physician on call contact you.   Following lower endoscopy (colonoscopy or flexible sigmoidoscopy):  Excessive amounts of blood in the stool  Significant tenderness or worsening of abdominal pains  Swelling of the abdomen that is new, acute  Fever of 100F or higher  FOLLOW UP: If any biopsies were taken you will be contacted by phone or by letter within the next 1-3 weeks.  Call your gastroenterologist if you have not heard about the biopsies in 3 weeks.  Our staff will call the home number listed on your records the next business day following your procedure to check on you and address any questions or concerns that you may have at that time regarding the information given to you following your procedure. This is a courtesy call and so if there is no answer at the home number and we have not heard from you through the emergency physician on call, we will assume that you have returned to your regular daily activities without incident.  SIGNATURES/CONFIDENTIALITY: You and/or your care partner have signed paperwork which will be entered into your electronic medical record.  These signatures attest to the fact that that the information above on your After Visit Summary has been reviewed and is understood.  Full responsibility of the confidentiality of this discharge information lies with you and/or your care-partner.  Read the handouts givento your by your recovery room nurse.

## 2013-07-28 ENCOUNTER — Telehealth: Payer: Self-pay | Admitting: *Deleted

## 2013-07-28 NOTE — Telephone Encounter (Signed)
  Follow up Call-  Call back number 07/25/2013  Post procedure Call Back phone  # [phone]  Permission to leave phone message Yes     Patient questions:  Do you have a fever, pain , or abdominal swelling? No. Pain Score  0 *  Have you tolerated food without any problems? Yes.    Have you been able to return to your normal activities? Yes.    Do you have any questions about your discharge instructions: Diet   No. Medications  No. Follow up visit  No.  Do you have questions or concerns about your Care? No.  Actions: * If pain score is 4 or above: No action needed, pain <4.

## 2013-08-11 ENCOUNTER — Other Ambulatory Visit: Payer: Self-pay

## 2013-08-13 ENCOUNTER — Other Ambulatory Visit: Payer: Self-pay

## 2013-08-13 DIAGNOSIS — Z1231 Encounter for screening mammogram for malignant neoplasm of breast: Secondary | ICD-10-CM

## 2013-08-21 ENCOUNTER — Ambulatory Visit
Admission: RE | Admit: 2013-08-21 | Discharge: 2013-08-21 | Disposition: A | Discharge status: Home / Self Care | Payer: 59 | Source: Ambulatory Visit

## 2013-08-21 DIAGNOSIS — Z1231 Encounter for screening mammogram for malignant neoplasm of breast: Secondary | ICD-10-CM

## 2013-10-28 ENCOUNTER — Other Ambulatory Visit (HOSPITAL_COMMUNITY)
Admission: RE | Admit: 2013-10-28 | Discharge: 2013-10-28 | Disposition: A | Discharge status: Home / Self Care | Payer: 59 | Source: Ambulatory Visit | Attending: Family Medicine | Admitting: Family Medicine

## 2013-10-28 ENCOUNTER — Other Ambulatory Visit: Payer: Self-pay | Admitting: Family Medicine

## 2013-10-28 DIAGNOSIS — Z124 Encounter for screening for malignant neoplasm of cervix: Secondary | ICD-10-CM | POA: Insufficient documentation

## 2013-10-28 DIAGNOSIS — Z1151 Encounter for screening for human papillomavirus (HPV): Secondary | ICD-10-CM | POA: Diagnosis present

## 2013-10-29 ENCOUNTER — Other Ambulatory Visit (HOSPITAL_COMMUNITY): Payer: Self-pay | Admitting: Family Medicine

## 2013-10-29 ENCOUNTER — Other Ambulatory Visit (HOSPITAL_COMMUNITY): Payer: 59

## 2013-10-29 ENCOUNTER — Ambulatory Visit (HOSPITAL_COMMUNITY): Payer: 59 | Attending: Family Medicine | Admitting: Radiology

## 2013-10-29 DIAGNOSIS — Z8249 Family history of ischemic heart disease and other diseases of the circulatory system: Secondary | ICD-10-CM | POA: Insufficient documentation

## 2013-10-29 DIAGNOSIS — I10 Essential (primary) hypertension: Secondary | ICD-10-CM | POA: Insufficient documentation

## 2013-10-29 DIAGNOSIS — IMO0001 Reserved for inherently not codable concepts without codable children: Secondary | ICD-10-CM

## 2013-10-29 DIAGNOSIS — E785 Hyperlipidemia, unspecified: Secondary | ICD-10-CM | POA: Insufficient documentation

## 2013-10-29 DIAGNOSIS — I358 Other nonrheumatic aortic valve disorders: Secondary | ICD-10-CM | POA: Diagnosis not present

## 2013-10-29 DIAGNOSIS — I700 Atherosclerosis of aorta: Secondary | ICD-10-CM

## 2013-10-29 LAB — CYTOLOGY - PAP

## 2013-10-29 NOTE — Progress Notes (Signed)
Echocardiogram performed.  

## 2013-10-30 ENCOUNTER — Other Ambulatory Visit (HOSPITAL_COMMUNITY): Payer: 59

## 2014-05-12 ENCOUNTER — Encounter: Payer: Self-pay | Admitting: Internal Medicine

## 2014-07-21 ENCOUNTER — Other Ambulatory Visit: Payer: Self-pay

## 2014-07-21 DIAGNOSIS — Z9289 Personal history of other medical treatment: Secondary | ICD-10-CM

## 2014-08-24 ENCOUNTER — Ambulatory Visit: Payer: Self-pay

## 2014-09-02 ENCOUNTER — Ambulatory Visit: Payer: Self-pay

## 2014-09-28 ENCOUNTER — Ambulatory Visit
Admission: RE | Admit: 2014-09-28 | Discharge: 2014-09-28 | Disposition: A | Discharge status: Home / Self Care | Payer: BLUE CROSS/BLUE SHIELD | Source: Ambulatory Visit

## 2014-09-28 DIAGNOSIS — Z9289 Personal history of other medical treatment: Secondary | ICD-10-CM

## 2014-12-07 IMAGING — MG MM SCREENING BREAST TOMO BILATERAL
9 series · 9 of 29 positions shown · non-contrast
Comparison: Previous exam(s).

CLINICAL DATA: Screening.

EXAM:
DIGITAL SCREENING BILATERAL MAMMOGRAM WITH 3D TOMO WITH CAD

[R CC (1 of 2)]
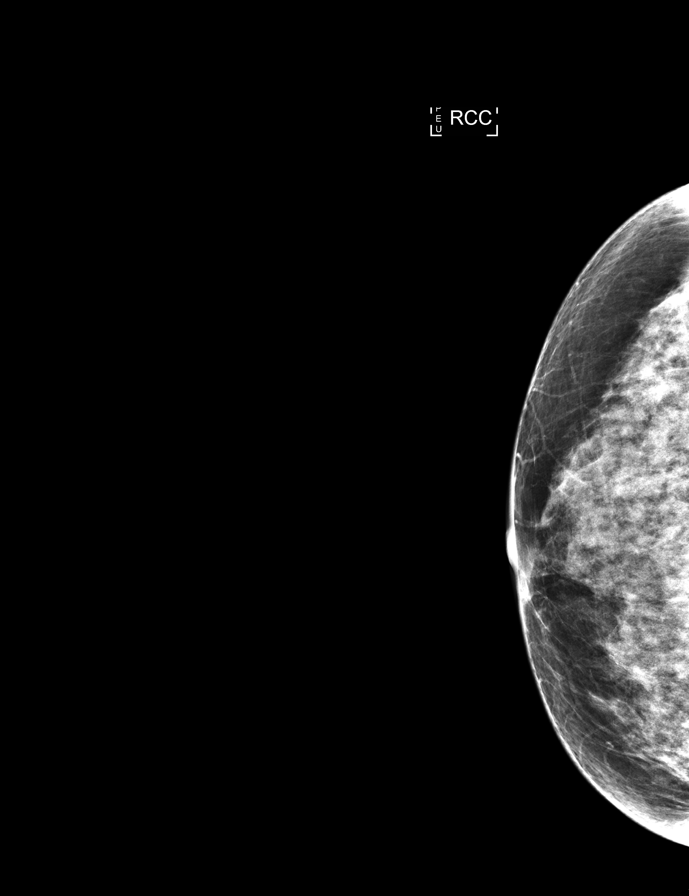

[L MLO]
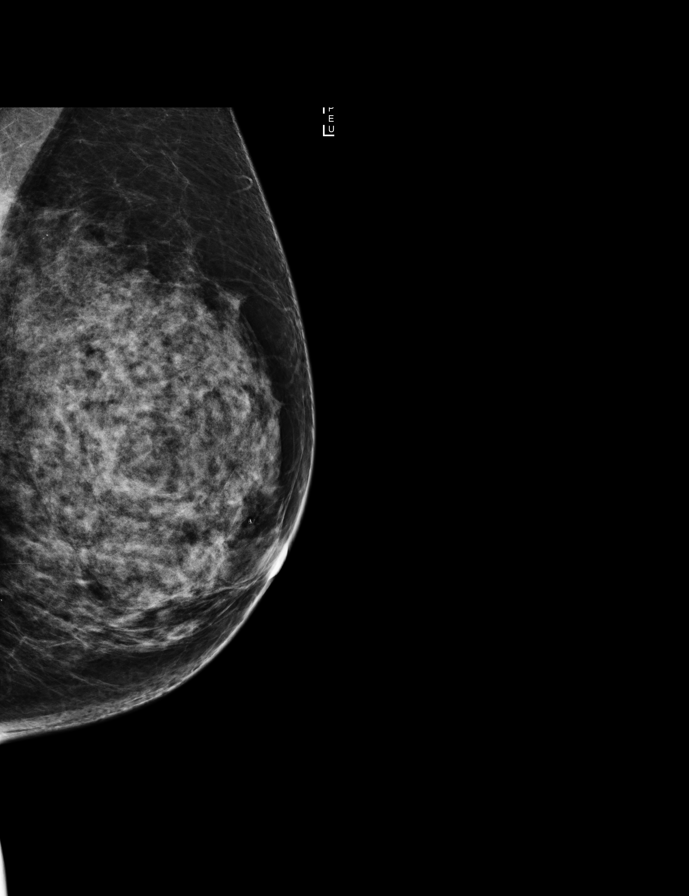

[R CC (2 of 2)]
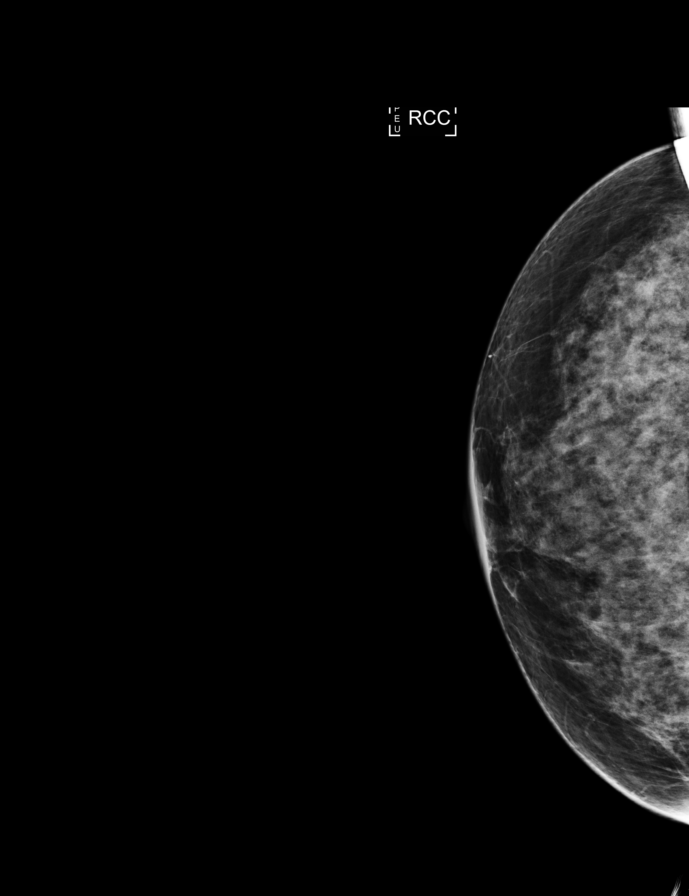

[R MLO]
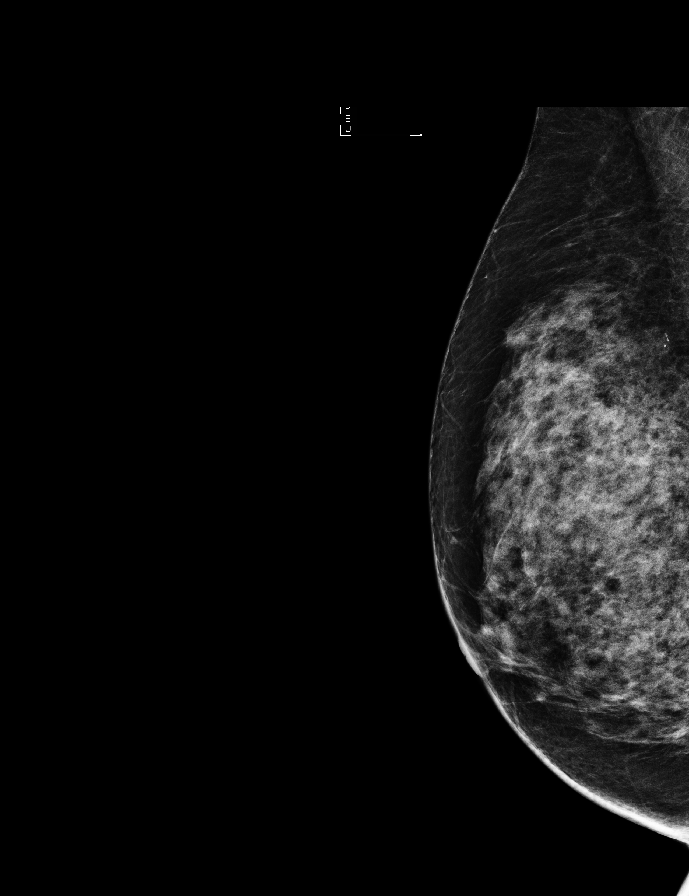

[R MLO tomo (1 of 2) · tomo slice 31/62.0]
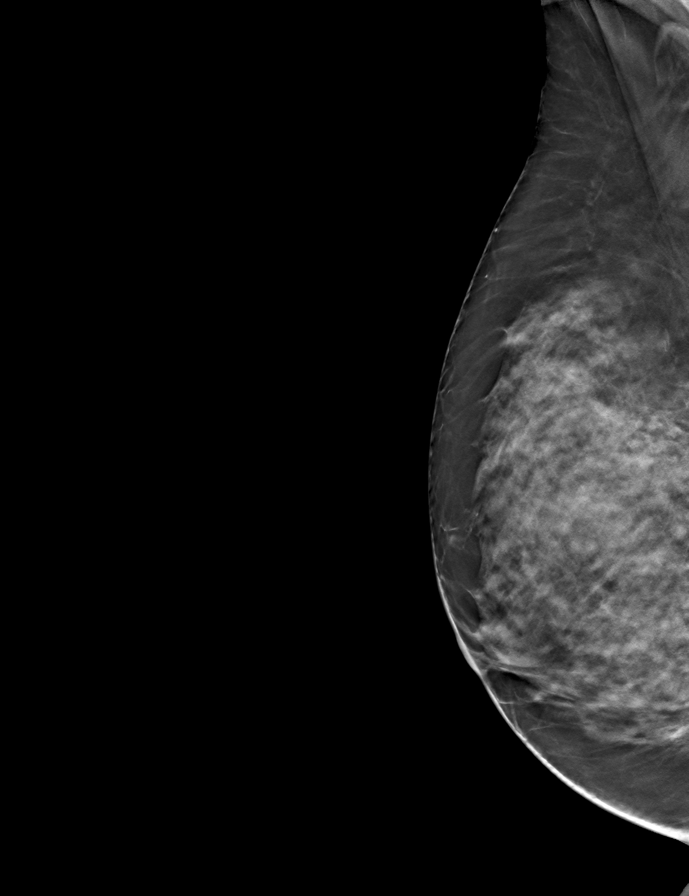

[R MLO tomo (2 of 2) · tomo slice 31/61.0]
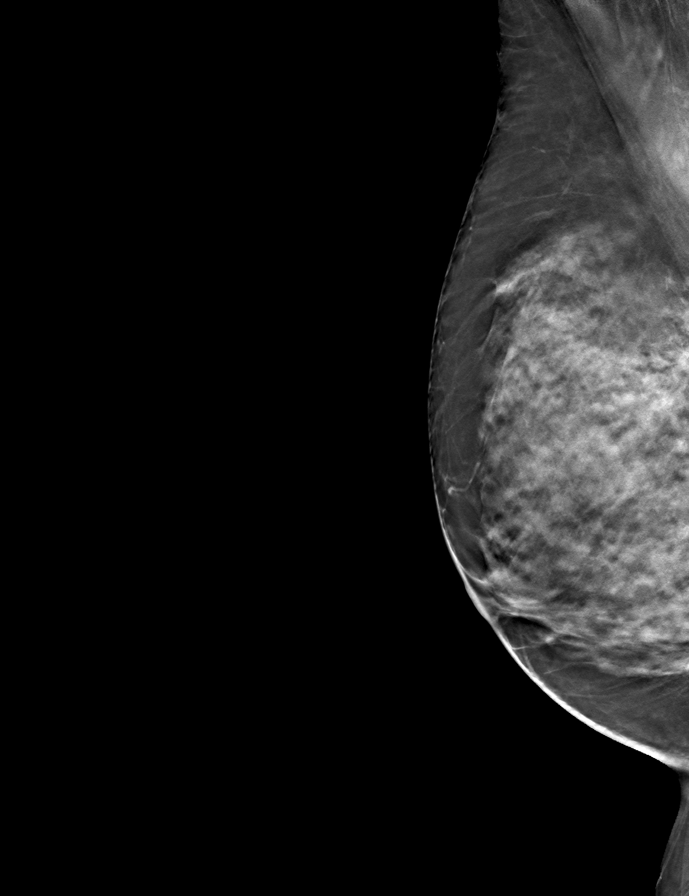

[R CC tomo (1 of 2) · tomo slice 31/60.0]
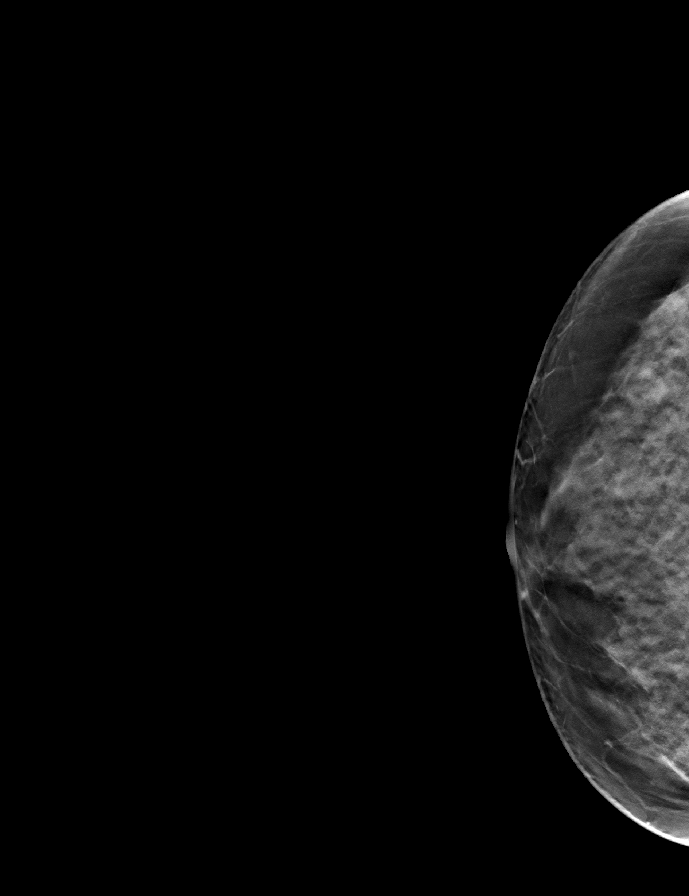

[R CC tomo (2 of 2) · tomo slice 31/62.0]
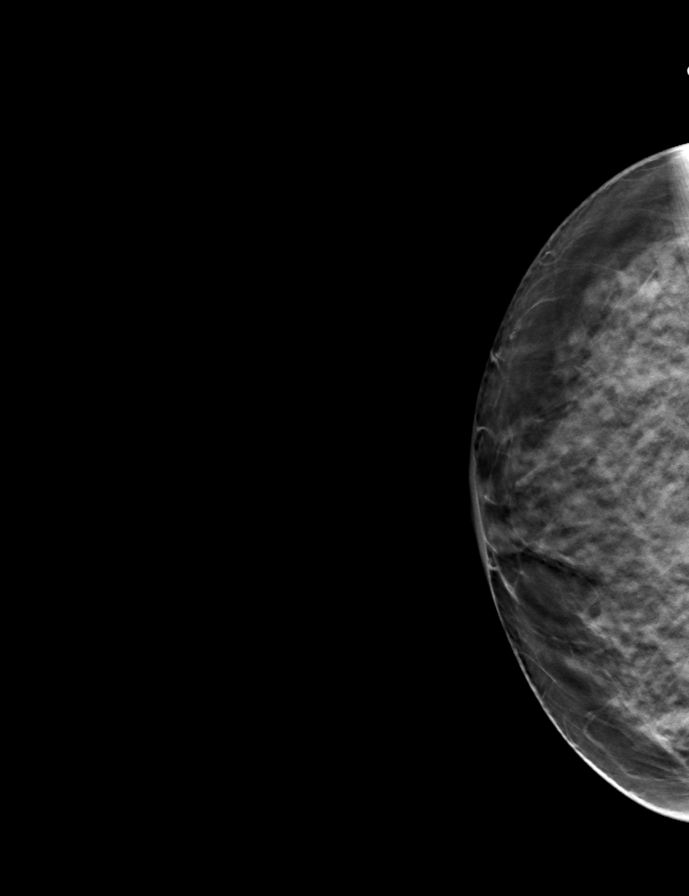

[L CC tomo · tomo slice 33/65.0]
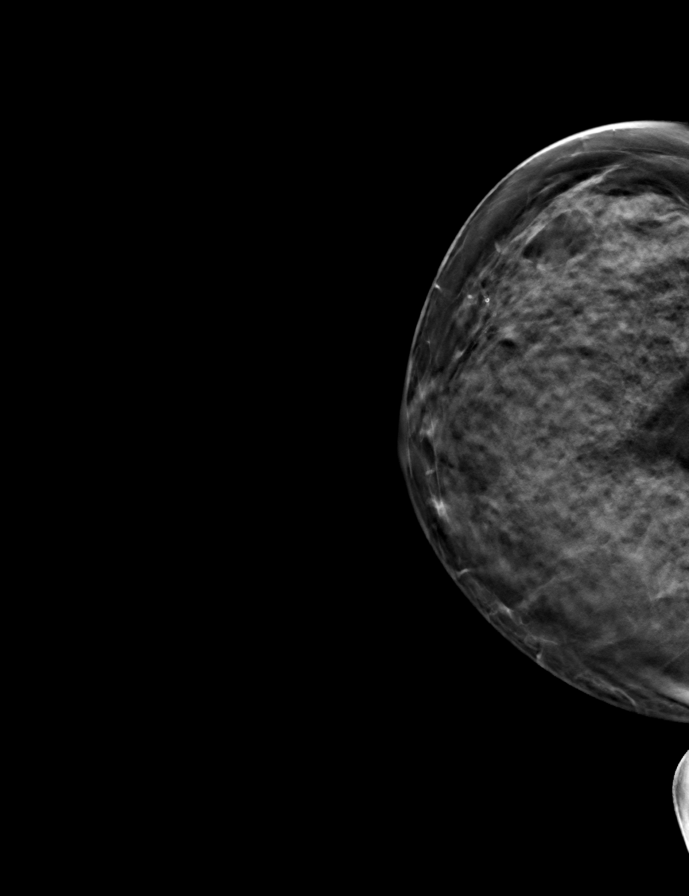

[9 of 29 positions shown; findings below may reference images not displayed]

ACR Breast Density Category d: The breast tissue is extremely dense,
which lowers the sensitivity of mammography.
FINDINGS: There has been no significant interval change and there are no
findings suspicious for malignancy. The right breast is smaller in
size mammographically when compared to the left which represents a
stable benign finding dating back to 5441. After multiple attempts,
the images provided represent the best study possible in obtaining
the extreme posterior tissue on the right CC mammographic view given
difficulties in positioning. Images were processed with CAD.
IMPRESSION: No mammographic evidence of malignancy. A result letter of this
screening mammogram will be mailed directly to the patient.

RECOMMENDATION:
Screening mammogram in one year unless there are intervening
clinical concerns. (Code:GA-D-FZV)

BI-RADS CATEGORY  2: Benign.

## 2015-09-20 ENCOUNTER — Other Ambulatory Visit: Payer: Self-pay | Admitting: Family Medicine

## 2015-09-20 DIAGNOSIS — Z1231 Encounter for screening mammogram for malignant neoplasm of breast: Secondary | ICD-10-CM

## 2015-09-30 ENCOUNTER — Ambulatory Visit
Admission: RE | Admit: 2015-09-30 | Discharge: 2015-09-30 | Disposition: A | Discharge status: Home / Self Care | Payer: 59 | Source: Ambulatory Visit | Attending: Family Medicine | Admitting: Family Medicine

## 2015-09-30 DIAGNOSIS — Z1231 Encounter for screening mammogram for malignant neoplasm of breast: Secondary | ICD-10-CM

## 2016-09-07 ENCOUNTER — Ambulatory Visit (INDEPENDENT_AMBULATORY_CARE_PROVIDER_SITE_OTHER): Payer: 59 | Admitting: Psychiatry

## 2016-09-07 ENCOUNTER — Encounter (INDEPENDENT_AMBULATORY_CARE_PROVIDER_SITE_OTHER): Payer: Self-pay

## 2016-09-07 ENCOUNTER — Encounter (HOSPITAL_COMMUNITY): Payer: Self-pay | Admitting: Psychiatry

## 2016-09-07 VITALS — BP 138/82 | HR 91 | Ht 64.0 in | Wt 113.0 lb

## 2016-09-07 DIAGNOSIS — Z811 Family history of alcohol abuse and dependence: Secondary | ICD-10-CM

## 2016-09-07 DIAGNOSIS — Z818 Family history of other mental and behavioral disorders: Secondary | ICD-10-CM

## 2016-09-07 DIAGNOSIS — Z87891 Personal history of nicotine dependence: Secondary | ICD-10-CM | POA: Diagnosis not present

## 2016-09-07 DIAGNOSIS — F4320 Adjustment disorder, unspecified: Secondary | ICD-10-CM

## 2016-09-07 DIAGNOSIS — Z6379 Other stressful life events affecting family and household: Secondary | ICD-10-CM

## 2016-09-07 NOTE — Progress Notes (Signed)
Psychiatric Initial Adult Assessment   Patient Identification: Kristin Fleming MRN:  [id number] Date of Evaluation:  09/07/2016 Referral Source: self Chief Complaint:   Chief Complaint    Anxiety     Visit Diagnosis: No diagnosis found.  History of Present Illness:  Kristin Fleming is a 66 year old female with no significant psychiatric history, she presents today seeking guidance and assistance in managing the psychiatric care needs of one of her family members, who struggles with schizophrenia. I spent time with the patient empathizing with her predicament, and listening to her consider some of her personal struggles with regard to the illness. She reports that she scheduled this appointment predominantly to get information on ways that she can help her loved one, and to seek expert consultation with this regard. She denies any significant mood or anxiety symptoms herself. Reports that she has consistent employment, excellent family support, and does not engage in any substance abuse. I spent time with the patient teaching her about the petition and commitment process in the state of [place], and Dentist for her with regard to psychiatric illness. She agrees to return to clinic or Higher education careers adviser if she is any questions or concerns in the meantime.    Past Psychiatric History: none  Previous Psychotropic Medications: No   Substance Abuse History in the last 12 months:  No.  Consequences of Substance Abuse: Negative  Past Medical History:  Past Medical History:  Diagnosis Date  . Allergic rhinitis   . Allergy   . Arthritis   . Dysmetabolic syndrome X   . Hypercholesteremia   . Hyperglycemia   . Hyperlipidemia   . Knee MCL sprain   . Knee pain    right  . Medial meniscus tear    Left  . Mitral valve prolapse   . Pes planus   . Unequal leg length (acquired)     Past Surgical History:  Procedure Laterality Date  . BREAST BIOPSY Left 1983   Left  ( benign)  . NASAL SEPTUM SURGERY  1980s  . TOE DEBRIDEMENT Left 07/23/13    Family Psychiatric History: brother - psychosis  Family History:  Family History  Problem Relation Age of Onset  . Heart disease Father   . Heart attack Father   . Diabetes Father   . Hypertension Mother   . Breast cancer Mother   . COPD Mother   . Rheum arthritis Sister   . Bipolar disorder Brother   . Alcohol abuse Brother   . Anxiety disorder Brother   . Colon cancer Neg Hx     Social History:   Social History   Social History  . Marital status: Married    Spouse name: N/A  . Number of children: N  . Years of education: N/A   Occupational History  . journalist/writer    Social History Main Topics  . Smoking status: Former Smoker    Packs/day: 0.30    Years: 14.00    Quit date: 01/10/1984  . Smokeless tobacco: Never Used     Comment: started at age 66.   Marland Kitchen Alcohol use 3.0 oz/week    5 Glasses of wine per week  . Drug use: No  . Sexual activity: Yes    Partners: Male    Birth control/ protection: None   Other Topics Concern  . None   Social History Narrative  . None    Additional Social History: journalist  Allergies:   Allergies  Allergen Reactions  .  Codeine     REACTION: GI upset  . Niacin Hives    REACTION: "flushing",   . Prednisone     REACTION: insomnia and gi upset    Metabolic Disorder Labs: No results found for: HGBA1C, MPG No results found for: PROLACTIN No results found for: CHOL, TRIG, HDL, CHOLHDL, VLDL, LDLCALC   Current Medications: Current Outpatient Prescriptions  Medication Sig Dispense Refill  . aspirin 81 MG tablet Take 81 mg by mouth daily.    . Cholecalciferol (VITAMIN D PO) Take by mouth daily.    . Coenzyme Q10 (COQ10) 100 MG CAPS Take 1 capsule by mouth daily.      . Probiotic Product (PROBIOTIC DAILY PO) Take by mouth daily.    . calcium carbonate (TUMS - DOSED IN MG ELEMENTAL CALCIUM) 500 MG chewable tablet Chew 1 tablet by mouth as  directed.      . Flaxseed, Linseed, (FLAX SEED OIL) 1000 MG CAPS Take 1 capsule by mouth daily.      Marland Kitchen. MELATONIN PO Take by mouth at bedtime as needed and may repeat dose one time if needed.    Cliffton Asters. White Panama[place]ow Bark POWD by Does not apply route daily.     No current facility-administered medications for this visit.     Neurologic: Headache: Negative Seizure: Negative Paresthesias:Negative  Musculoskeletal: Strength & Muscle Tone: within normal limits Gait & Station: normal Patient leans: N/A  Psychiatric Specialty Exam: ROS  Blood pressure 138/82, pulse 91, height 5\' 4"  (1.626 m), weight 113 lb (51.3 kg).Body mass index is 19.4 kg/m.  General Appearance: Casual and Well Groomed  Eye Contact:  Good  Speech:  Clear and Coherent  Volume:  Normal  Mood:  Euthymic  Affect:  Congruent  Thought Process:  Goal Directed  Orientation:  Full (Time, Place, and Person)  Thought Content:  Logical  Suicidal Thoughts:  No  Homicidal Thoughts:  No  Memory:  NA  Judgement:  Good  Insight:  Good  Psychomotor Activity:  Normal  Concentration:  Concentration: Good  Recall:  Good  Fund of Knowledge:Good  Language: Good  Akathisia:  Negative  Handed:  Right  AIMS (if indicated):  0  Assets:  Communication Skills Desire for Improvement Financial Resources/Insurance Housing Intimacy Leisure Time Physical Health Resilience Social Support Talents/Skills Transportation Vocational/Educational  ADL's:  Intact  Cognition: WNL  Sleep:  5-6 hours    Treatment Plan Summary: Nolon LennertCynthia H Fleming is a 66 year old female with no significant psychiatric history, who presents today with some adjustment difficulties in the face of family illness. She was predominantly interested in psychoeducation regarding schizophrenia, and education on resources in the community. She has no need for psychiatric follow-up or individual therapy at this time, and will contact clinic if she has any concerns or needs in  the future. She was receptive to education provided regarding thought disorder, and we reviewed ways for her to be able to advocate for her family member with mental illness.  Burnard LeighAlexander Arya Eksir, MD 8/30/20183:06 PM

## 2016-09-08 ENCOUNTER — Encounter (HOSPITAL_COMMUNITY): Payer: Self-pay | Admitting: Psychiatry

## 2016-09-10 ENCOUNTER — Encounter (HOSPITAL_COMMUNITY): Payer: Self-pay | Admitting: Psychiatry

## 2016-09-18 ENCOUNTER — Other Ambulatory Visit: Payer: Self-pay | Admitting: Family Medicine

## 2016-09-18 DIAGNOSIS — Z1231 Encounter for screening mammogram for malignant neoplasm of breast: Secondary | ICD-10-CM

## 2016-10-03 ENCOUNTER — Ambulatory Visit
Admission: RE | Admit: 2016-10-03 | Discharge: 2016-10-03 | Disposition: A | Discharge status: Home / Self Care | Payer: 59 | Source: Ambulatory Visit | Attending: Family Medicine | Admitting: Family Medicine

## 2016-10-03 DIAGNOSIS — Z1231 Encounter for screening mammogram for malignant neoplasm of breast: Secondary | ICD-10-CM

## 2016-10-04 ENCOUNTER — Other Ambulatory Visit: Payer: Self-pay | Admitting: Family Medicine

## 2016-10-04 DIAGNOSIS — R928 Other abnormal and inconclusive findings on diagnostic imaging of breast: Secondary | ICD-10-CM

## 2016-10-05 ENCOUNTER — Ambulatory Visit (HOSPITAL_COMMUNITY): Payer: 59 | Admitting: Psychiatry

## 2016-10-06 ENCOUNTER — Other Ambulatory Visit: Payer: Self-pay | Admitting: Family Medicine

## 2016-10-06 ENCOUNTER — Ambulatory Visit
Admission: RE | Admit: 2016-10-06 | Discharge: 2016-10-06 | Disposition: A | Discharge status: Home / Self Care | Payer: 59 | Source: Ambulatory Visit | Attending: Family Medicine | Admitting: Family Medicine

## 2016-10-06 DIAGNOSIS — R928 Other abnormal and inconclusive findings on diagnostic imaging of breast: Secondary | ICD-10-CM

## 2016-10-06 DIAGNOSIS — R921 Mammographic calcification found on diagnostic imaging of breast: Secondary | ICD-10-CM

## 2016-10-12 ENCOUNTER — Other Ambulatory Visit: Payer: Self-pay | Admitting: Family Medicine

## 2016-10-12 ENCOUNTER — Ambulatory Visit
Admission: RE | Admit: 2016-10-12 | Discharge: 2016-10-12 | Disposition: A | Discharge status: Home / Self Care | Payer: 59 | Source: Ambulatory Visit | Attending: Family Medicine | Admitting: Family Medicine

## 2016-10-12 DIAGNOSIS — R921 Mammographic calcification found on diagnostic imaging of breast: Secondary | ICD-10-CM

## 2017-04-24 ENCOUNTER — Ambulatory Visit (INDEPENDENT_AMBULATORY_CARE_PROVIDER_SITE_OTHER): Payer: 59 | Admitting: Allergy and Immunology

## 2017-04-24 ENCOUNTER — Encounter: Payer: Self-pay | Admitting: Allergy and Immunology

## 2017-04-24 VITALS — BP 148/90 | HR 68 | Temp 97.8°F | Resp 16 | Ht 62.0 in | Wt 112.6 lb

## 2017-04-24 DIAGNOSIS — R062 Wheezing: Secondary | ICD-10-CM | POA: Diagnosis not present

## 2017-04-24 DIAGNOSIS — J3089 Other allergic rhinitis: Secondary | ICD-10-CM | POA: Diagnosis not present

## 2017-04-24 DIAGNOSIS — H1013 Acute atopic conjunctivitis, bilateral: Secondary | ICD-10-CM

## 2017-04-24 DIAGNOSIS — R030 Elevated blood-pressure reading, without diagnosis of hypertension: Secondary | ICD-10-CM

## 2017-04-24 DIAGNOSIS — H1010 Acute atopic conjunctivitis, unspecified eye: Secondary | ICD-10-CM | POA: Insufficient documentation

## 2017-04-24 MED ORDER — ALBUTEROL SULFATE HFA 108 (90 BASE) MCG/ACT IN AERS: 2.0000 | INHALATION_SPRAY | RESPIRATORY_TRACT | 3 refills | Status: DC | PRN

## 2017-04-24 MED ORDER — OLOPATADINE HCL 0.2 % OP SOLN: 1.0000 [drp] | Freq: Every day | OPHTHALMIC | 5 refills | Status: DC | PRN

## 2017-04-24 MED ORDER — IPRATROPIUM BROMIDE 0.06 % NA SOLN: NASAL | 5 refills | Status: DC

## 2017-04-24 MED ORDER — CARBINOXAMINE MALEATE 4 MG PO TABS: 1.0000 | ORAL_TABLET | Freq: Three times a day (TID) | ORAL | 5 refills | Status: DC | PRN

## 2017-04-24 MED ORDER — MONTELUKAST SODIUM 10 MG PO TABS: 10.0000 mg | ORAL_TABLET | Freq: Every day | ORAL | 5 refills | Status: DC

## 2017-04-24 NOTE — Patient Instructions (Addendum)
Allergic rhinitis with a possible nonallergic component  Aeroallergen avoidance measures have been discussed and provided in written form.  A prescription has been provided for carbinoxamine maleate 4 mg every 6-8 hours as needed.    A prescription has been provided for montelukast 10 mg daily at bedtime.  A prescription has been provided for ipratropium 0.06% nasal spray, 2 sprays per nostril 2 or 3 times daily as needed.  Nasal saline spray (i.e., Simply Saline) or nasal saline lavage (i.e., NeilMed) is recommended as needed and prior to medicated nasal sprays.  If allergen avoidance measures and medications fail to adequately relieve symptoms, aeroallergen immunotherapy will be considered.  Allergic conjunctivitis  Treatment plan as outlined above for allergic rhinitis.  A prescription has been provided for Pataday, one drop per eye daily as needed.  I have also recommended eye lubricant drops (i.e., Natural Tears) as needed.  Coughing/wheezing Todays spirometry results, assessed while asymptomatic, reveals an obstructive pattern without significant postbronchodilator improvement.  Today's results do not meet the ATS arteria for the diagnosis of asthma.  A laboratory order form has been provided for serum levels of alpha-1 antitrypsin as well as alpha-1 antitrypsin genotype.  Montelukast has been prescribed (as above).  Prescription has been provided for albuterol HFA, 1-2 inhalations every 6 hours if needed and 15 minutes prior to vigorous exercise.  Subjective and objective measures of pulmonary function will be followed and the treatment plan will be adjusted accordingly.  Elevated blood-pressure reading without diagnosis of hypertension  The patient has been made aware of the elevated blood pressure reading and has been encouraged to follow up with her primary care physician in the near future regarding this issue.     Return in about 3 months (around 07/24/2017), or if  symptoms worsen or fail to improve.  Control of Mold Allergen  Mold and fungi can grow on a variety of surfaces provided certain temperature and moisture conditions exist.  Outdoor molds grow on plants, decaying vegetation and soil.  The major outdoor mold, Alternaria and Cladosporium, are found in very high numbers during hot and dry conditions.  Generally, a late Summer - Fall peak is seen for common outdoor fungal spores.  Rain will temporarily lower outdoor mold spore count, but counts rise rapidly when the rainy period ends.  The most important indoor molds are Aspergillus and Penicillium.  Dark, humid and poorly ventilated basements are ideal sites for mold growth.  The next most common sites of mold growth are the bathroom and the kitchen.  Outdoor [employer] 2. Use air conditioning and keep windows closed 3. Avoid exposure to decaying vegetation. 4. Avoid leaf raking. 5. Avoid grain handling. 6. Consider wearing a face mask if working in moldy areas.  Indoor Mold Control 1. Maintain humidity below 50%. 2. Clean washable surfaces with 5% bleach solution. 3. Remove sources e.g. Contaminated carpets.  Reducing Pollen Exposure  The American Academy of Allergy, Asthma and Immunology suggests the following steps to reduce your exposure to pollen during allergy seasons.    1. Do not hang sheets or clothing out to dry; pollen may collect on these items. 2. Do not mow lawns or spend time around freshly cut grass; mowing stirs up pollen. 3. Keep windows closed at night.  Keep car windows closed while driving. 4. Minimize morning activities outdoors, a time when pollen counts are usually at their highest. 5. Stay indoors as much as possible when pollen counts or humidity is high and on windy days  when pollen tends to remain in the air longer. 6. Use air conditioning when possible.  Many air conditioners have filters that trap the pollen spores. 7. Use a HEPA room air filter to remove  pollen form the indoor air you breathe.

## 2017-04-24 NOTE — Assessment & Plan Note (Signed)
   Treatment plan as outlined above for allergic rhinitis.  A prescription has been provided for Pataday, one drop per eye daily as needed.  I have also recommended eye lubricant drops (i.e., Natural Tears) as needed. 

## 2017-04-24 NOTE — Assessment & Plan Note (Signed)
   The patient has been made aware of the elevated blood pressure reading and has been encouraged to follow up with her primary care physician in the near future regarding this issue. 

## 2017-04-24 NOTE — Assessment & Plan Note (Addendum)
   Aeroallergen avoidance measures have been discussed and provided in written form.  A prescription has been provided for carbinoxamine maleate 4 mg every 6-8 hours as needed.    A prescription has been provided for montelukast 10 mg daily at bedtime.  A prescription has been provided for ipratropium 0.06% nasal spray, 2 sprays per nostril 2 or 3 times daily as needed.  Nasal saline spray (i.e., Simply Saline) or nasal saline lavage (i.e., NeilMed) is recommended as needed and prior to medicated nasal sprays.  If allergen avoidance measures and medications fail to adequately relieve symptoms, aeroallergen immunotherapy will be considered.

## 2017-04-24 NOTE — Assessment & Plan Note (Addendum)
Todays spirometry results, assessed while asymptomatic, reveals an obstructive pattern without significant postbronchodilator improvement.  Today's results do not meet the ATS arteria for the diagnosis of asthma.  A laboratory order form has been provided for serum levels of alpha-1 antitrypsin as well as alpha-1 antitrypsin genotype.  Montelukast has been prescribed (as above).  Prescription has been provided for albuterol HFA, 1-2 inhalations every 6 hours if needed and 15 minutes prior to vigorous exercise.  Subjective and objective measures of pulmonary function will be followed and the treatment plan will be adjusted accordingly.

## 2017-04-24 NOTE — Progress Notes (Signed)
New Patient Note  RE: Kristin Fleming MRN: [id number] DOB: 07/31/1950 Date of Office Visit: 04/24/2017  Referring provider: Lupita Raider, MD Primary care provider: Lupita Raider, MD  Chief Complaint: Allergic Rhinitis    History of present illness: Kristin Fleming is a 67 y.o. female seen today in consultation requested by Lupita Raider, MD.  She complains of nasal congestion, rhinorrhea, sneezing, postnasal drainage, hoarseness, nasal pruritus, ocular pruritus, and lacrimation.  The symptoms occur year around but are more frequent and severe during the spring and fall.  She has tried over-the-counter antihistamines and Nasalcrom without adequate symptom relief.  She notes a diminished sense of smell though she can still smell to some degree and she can taste food.  She notes occasional episodes of wheezing and coughing.  This typically occurs during exercise with pollen exposure, she has also experienced wheezing and coughing with respiratory tract infections.  Assessment and plan: Allergic rhinitis with a possible nonallergic component  Aeroallergen avoidance measures have been discussed and provided in written form.  A prescription has been provided for carbinoxamine maleate 4 mg every 6-8 hours as needed.    A prescription has been provided for montelukast 10 mg daily at bedtime.  A prescription has been provided for ipratropium 0.06% nasal spray, 2 sprays per nostril 2 or 3 times daily as needed.  Nasal saline spray (i.e., Simply Saline) or nasal saline lavage (i.e., NeilMed) is recommended as needed and prior to medicated nasal sprays.  If allergen avoidance measures and medications fail to adequately relieve symptoms, aeroallergen immunotherapy will be considered.  Allergic conjunctivitis  Treatment plan as outlined above for allergic rhinitis.  A prescription has been provided for Pataday, one drop per eye daily as needed.  I have also recommended eye lubricant drops  (i.e., Natural Tears) as needed.  Coughing/wheezing Todays spirometry results, assessed while asymptomatic, reveals an obstructive pattern without significant postbronchodilator improvement.  Today's results do not meet the ATS arteria for the diagnosis of asthma.  A laboratory order form has been provided for serum levels of alpha-1 antitrypsin as well as alpha-1 antitrypsin genotype.  Montelukast has been prescribed (as above).  Prescription has been provided for albuterol HFA, 1-2 inhalations every 6 hours if needed and 15 minutes prior to vigorous exercise.  Subjective and objective measures of pulmonary function will be followed and the treatment plan will be adjusted accordingly.  Elevated blood-pressure reading without diagnosis of hypertension  The patient has been made aware of the elevated blood pressure reading and has been encouraged to follow up with her primary care physician in the near future regarding this issue.     Meds ordered this encounter  Medications  . montelukast (SINGULAIR) 10 MG tablet    Sig: Take 1 tablet (10 mg total) by mouth at bedtime.    Dispense:  30 tablet    Refill:  5  . Carbinoxamine Maleate 4 MG TABS    Sig: Take 1 tablet (4 mg total) by mouth every 8 (eight) hours as needed.    Dispense:  30 each    Refill:  5  . ipratropium (ATROVENT) 0.06 % nasal spray    Sig: 2 sprays per nostril 2 or 3 times daily as needed    Dispense:  15 mL    Refill:  5  . Olopatadine HCl (PATADAY) 0.2 % SOLN    Sig: Place 1 drop into both eyes daily as needed.    Dispense:  1 Bottle    Refill:  5  . albuterol (PROAIR HFA) 108 (90 Base) MCG/ACT inhaler    Sig: Inhale 2 puffs into the lungs every 4 (four) hours as needed for wheezing or shortness of breath.    Dispense:  1 Inhaler    Refill:  3    Diagnostics: Spirometry: FVC was 2.33 L (81% predicted) and FEV1 was 1.49 L (68% predicted), FEV1 ratio of 83%.  There was 60 mL (4%) post bronchodilator  improvement.  Obstructive pattern without significant reversibility.  This study was performed while the patient was asymptomatic.  Please see scanned spirometry results for details. Epicutaneous testing: Positive to grass pollen. Intradermal testing: Positive to Johnson grass and penicillium mold mix.    Physical examination: Blood pressure (!) 148/90, pulse 68, temperature 97.8 F (36.6 C), temperature source Oral, resp. rate 16, height 5\' 2"  (1.575 m), weight 112 lb 9.6 oz (51.1 kg).  General: Alert, interactive, in no acute distress. HEENT: TMs pearly gray, turbinates edematous with clear discharge, post-pharynx moderately erythematous. Neck: Supple without lymphadenopathy. Lungs: Mildly decreased breath sounds bilaterally without wheezing, rhonchi or rales. CV: Normal S1, S2 without murmurs. Abdomen: Nondistended, nontender. Skin: Warm and dry, without lesions or rashes. Extremities:  No clubbing, cyanosis or edema. Neuro:   Grossly intact.  Review of systems:  Review of systems negative except as noted in HPI / PMHx or noted below: Review of Systems  Constitutional: Negative.   HENT: Negative.   Eyes: Negative.   Respiratory: Negative.   Cardiovascular: Negative.   Gastrointestinal: Negative.   Genitourinary: Negative.   Musculoskeletal: Negative.   Skin: Negative.   Neurological: Negative.   Endo/Heme/Allergies: Negative.   Psychiatric/Behavioral: Negative.     Past medical history:  Past Medical History:  Diagnosis Date  . Allergic rhinitis   . Allergy   . Arthritis   . Dysmetabolic syndrome X   . Hypercholesteremia   . Hyperglycemia   . Hyperlipidemia   . Knee MCL sprain   . Knee pain    right  . Medial meniscus tear    Left  . Mitral valve prolapse   . Pes planus   . Unequal leg length (acquired)     Past surgical history:  Past Surgical History:  Procedure Laterality Date  . BREAST BIOPSY Left 1983   Left ( benign)  . BREAST EXCISIONAL BIOPSY  Left    1979 or 1980  . NASAL SEPTUM SURGERY  1980s  . SINOSCOPY    . TOE DEBRIDEMENT Left 07/23/13    Family history: Family History  Problem Relation Age of Onset  . Heart disease Father   . Heart attack Father   . Diabetes Father   . Hypertension Mother   . Breast cancer Mother [id number]  . COPD Mother   . Rheum arthritis Sister   . Breast cancer Sister [id number]  . Bipolar disorder Brother   . Alcohol abuse Brother   . Anxiety disorder Brother   . Breast cancer Maternal Grandmother [id number]  . Allergic rhinitis Other   . Asthma Other   . Colon cancer Neg Hx   . Angioedema Neg Hx   . Eczema Neg Hx   . Immunodeficiency Neg Hx   . Urticaria Neg Hx     Social history: Social History   Socioeconomic History  . Marital status: Married    Spouse name: Not on file  . Number of children: N  . Years of education: Not on file  . Highest education level: Not on file  Occupational  History  . Occupation: journalist/writer  Social Needs  . Financial resource strain: Not on file  . Food insecurity:    Worry: Not on file    Inability: Not on file  . Transportation needs:    Medical: Not on file    Non-medical: Not on file  Tobacco Use  . Smoking status: Former Smoker    Packs/day: 0.30    Years: 14.00    Pack years: 4.20    Last attempt to quit: 01/10/1984    Years since quitting: 33.3  . Smokeless tobacco: Never Used  . Tobacco comment: started at age 78.   Substance and Sexual Activity  . Alcohol use: Yes    Alcohol/week: 3.0 oz    Types: 5 Glasses of wine per week  . Drug use: No  . Sexual activity: Yes    Partners: Male    Birth control/protection: None  Lifestyle  . Physical activity:    Days per week: Not on file    Minutes per session: Not on file  . Stress: Not on file  Relationships  . Social connections:    Talks on phone: Not on file    Gets together: Not on file    Attends religious service: Not on file    Active member of club or organization: Not on file     Attends meetings of clubs or organizations: Not on file    Relationship status: Not on file  . Intimate partner violence:    Fear of current or ex partner: Not on file    Emotionally abused: Not on file    Physically abused: Not on file    Forced sexual activity: Not on file  Other Topics Concern  . Not on file  Social History Narrative  . Not on file   Environmental History: The patient lives in a 66 year old house with hardwood floors throughout, gas heat, and central air.  There is 1 dog in the home which has access to her bedroom.  She is a non-smoker.  There is mold/water damage in the home.  Allergies as of 04/24/2017      Reactions   Codeine    REACTION: GI upset   Niacin Hives   REACTION: "flushing",    Prednisone    REACTION: insomnia and gi upset      Medication List        Accurate as of 04/24/17  6:38 PM. Always use your most recent med list.          ACIDOPHILUS/PECTIN PO Take by mouth.   albuterol 108 (90 Base) MCG/ACT inhaler Commonly known as:  PROAIR HFA Inhale 2 puffs into the lungs every 4 (four) hours as needed for wheezing or shortness of breath.   aspirin 81 MG tablet Take 81 mg by mouth daily.   Biotin 16109 MCG Tabs Take by mouth.   Carbinoxamine Maleate 4 MG Tabs Take 1 tablet (4 mg total) by mouth every 8 (eight) hours as needed.   dicyclomine 10 MG capsule Commonly known as:  BENTYL Take 10 mg by mouth 4 (four) times daily -  before meals and at bedtime.   finasteride 1 MG tablet Commonly known as:  PROPECIA Take 1 mg by mouth daily.   hydrochlorothiazide 25 MG tablet Commonly known as:  HYDRODIURIL Take 25 mg by mouth daily.   ipratropium 0.06 % nasal spray Commonly known as:  ATROVENT 2 sprays per nostril 2 or 3 times daily as needed   ketotifen 0.025 %  ophthalmic solution Commonly known as:  ZADITOR 1 drop 2 (two) times daily.   magnesium gluconate 500 MG tablet Commonly known as:  MAGONATE Take 500 mg by mouth 2 (two)  times daily.   montelukast 10 MG tablet Commonly known as:  SINGULAIR Take 1 tablet (10 mg total) by mouth at bedtime.   Olopatadine HCl 0.2 % Soln Commonly known as:  PATADAY Place 1 drop into both eyes daily as needed.   POTASSIUM AMINOBENZOATE PO Take by mouth.   PROBIOTIC PO Take by mouth.   Valerian Root 530 MG Caps Take by mouth.   VITAMIN D PO Take by mouth daily.   [employer] Powd by Does not apply route daily.       Known medication allergies: Allergies  Allergen Reactions  . Codeine     REACTION: GI upset  . Niacin Hives    REACTION: "flushing",   . Prednisone     REACTION: insomnia and gi upset    I appreciate the opportunity to take part in Jaylean's care. Please do not hesitate to contact me with questions.  Sincerely,   R. Jorene Guest, MD

## 2017-04-25 ENCOUNTER — Other Ambulatory Visit: Payer: Self-pay

## 2017-04-25 DIAGNOSIS — R030 Elevated blood-pressure reading, without diagnosis of hypertension: Secondary | ICD-10-CM

## 2017-04-25 DIAGNOSIS — J3089 Other allergic rhinitis: Secondary | ICD-10-CM

## 2017-04-26 LAB — ALPHA-1-ANTITRYPSIN: A-1 Antitrypsin: 113 mg/dL (ref 90–200)

## 2017-05-07 LAB — ALPHA-1 ANTITRYPSIN PHENOTYPE: A-1 Antitrypsin: 112 mg/dL (ref 90–200)

## 2017-06-12 ENCOUNTER — Other Ambulatory Visit: Payer: Self-pay | Admitting: Allergy and Immunology

## 2017-06-12 DIAGNOSIS — J3089 Other allergic rhinitis: Secondary | ICD-10-CM

## 2017-07-24 ENCOUNTER — Ambulatory Visit: Payer: 59 | Admitting: Allergy and Immunology

## 2017-08-13 ENCOUNTER — Ambulatory Visit: Payer: 59 | Admitting: Allergy and Immunology

## 2017-09-18 ENCOUNTER — Ambulatory Visit: Payer: 59 | Admitting: Allergy and Immunology

## 2017-11-01 ENCOUNTER — Other Ambulatory Visit: Payer: Self-pay | Admitting: Family Medicine

## 2017-11-01 DIAGNOSIS — Z1231 Encounter for screening mammogram for malignant neoplasm of breast: Secondary | ICD-10-CM

## 2017-11-21 ENCOUNTER — Ambulatory Visit
Admission: RE | Admit: 2017-11-21 | Discharge: 2017-11-21 | Disposition: A | Discharge status: Home / Self Care | Payer: 59 | Source: Ambulatory Visit

## 2017-11-21 DIAGNOSIS — Z1231 Encounter for screening mammogram for malignant neoplasm of breast: Secondary | ICD-10-CM

## 2018-10-21 ENCOUNTER — Other Ambulatory Visit: Payer: Self-pay | Admitting: Family Medicine

## 2018-10-21 DIAGNOSIS — Z1231 Encounter for screening mammogram for malignant neoplasm of breast: Secondary | ICD-10-CM

## 2018-12-02 ENCOUNTER — Other Ambulatory Visit: Payer: Self-pay

## 2018-12-02 ENCOUNTER — Ambulatory Visit
Admission: RE | Admit: 2018-12-02 | Discharge: 2018-12-02 | Disposition: A | Discharge status: Home / Self Care | Payer: 59 | Source: Ambulatory Visit

## 2018-12-02 DIAGNOSIS — Z1231 Encounter for screening mammogram for malignant neoplasm of breast: Secondary | ICD-10-CM

## 2018-12-11 ENCOUNTER — Other Ambulatory Visit: Payer: Self-pay | Admitting: Physician Assistant

## 2018-12-11 ENCOUNTER — Ambulatory Visit
Admission: RE | Admit: 2018-12-11 | Discharge: 2018-12-11 | Disposition: A | Discharge status: Home / Self Care | Payer: 59 | Source: Ambulatory Visit | Attending: Physician Assistant | Admitting: Physician Assistant

## 2018-12-11 DIAGNOSIS — S61451A Open bite of right hand, initial encounter: Secondary | ICD-10-CM

## 2018-12-11 DIAGNOSIS — W540XXA Bitten by dog, initial encounter: Secondary | ICD-10-CM

## 2019-02-02 ENCOUNTER — Encounter (INDEPENDENT_AMBULATORY_CARE_PROVIDER_SITE_OTHER): Payer: Self-pay

## 2019-11-11 ENCOUNTER — Other Ambulatory Visit: Payer: Self-pay | Admitting: Family Medicine

## 2019-11-11 DIAGNOSIS — Z1231 Encounter for screening mammogram for malignant neoplasm of breast: Secondary | ICD-10-CM

## 2019-11-21 DIAGNOSIS — Z1231 Encounter for screening mammogram for malignant neoplasm of breast: Secondary | ICD-10-CM

## 2019-12-10 ENCOUNTER — Other Ambulatory Visit: Payer: Self-pay

## 2019-12-10 ENCOUNTER — Ambulatory Visit
Admission: RE | Admit: 2019-12-10 | Discharge: 2019-12-10 | Disposition: A | Discharge status: Home / Self Care | Payer: No Typology Code available for payment source | Source: Ambulatory Visit | Attending: Family Medicine | Admitting: Family Medicine

## 2019-12-10 DIAGNOSIS — Z1231 Encounter for screening mammogram for malignant neoplasm of breast: Secondary | ICD-10-CM

## 2019-12-23 ENCOUNTER — Other Ambulatory Visit: Payer: Self-pay | Admitting: Family Medicine

## 2019-12-23 DIAGNOSIS — E2839 Other primary ovarian failure: Secondary | ICD-10-CM

## 2020-03-29 ENCOUNTER — Other Ambulatory Visit: Payer: No Typology Code available for payment source

## 2020-04-20 ENCOUNTER — Other Ambulatory Visit: Payer: Self-pay

## 2020-04-24 ENCOUNTER — Ambulatory Visit
Admission: RE | Admit: 2020-04-24 | Discharge: 2020-04-24 | Disposition: A | Discharge status: Home / Self Care | Payer: 59 | Source: Ambulatory Visit | Attending: Family Medicine | Admitting: Family Medicine

## 2020-04-24 ENCOUNTER — Other Ambulatory Visit: Payer: Self-pay

## 2020-04-24 DIAGNOSIS — E2839 Other primary ovarian failure: Secondary | ICD-10-CM

## 2020-10-28 ENCOUNTER — Other Ambulatory Visit: Payer: Self-pay | Admitting: Family Medicine

## 2020-10-28 DIAGNOSIS — Z1231 Encounter for screening mammogram for malignant neoplasm of breast: Secondary | ICD-10-CM

## 2020-12-13 ENCOUNTER — Ambulatory Visit
Admission: RE | Admit: 2020-12-13 | Discharge: 2020-12-13 | Disposition: A | Discharge status: Home / Self Care | Payer: 59 | Source: Ambulatory Visit

## 2020-12-13 ENCOUNTER — Other Ambulatory Visit: Payer: Self-pay

## 2020-12-13 DIAGNOSIS — Z1231 Encounter for screening mammogram for malignant neoplasm of breast: Secondary | ICD-10-CM

## 2020-12-17 ENCOUNTER — Other Ambulatory Visit: Payer: Self-pay | Admitting: Family Medicine

## 2020-12-17 DIAGNOSIS — Z803 Family history of malignant neoplasm of breast: Secondary | ICD-10-CM

## 2021-05-13 ENCOUNTER — Ambulatory Visit
Admission: RE | Admit: 2021-05-13 | Discharge: 2021-05-13 | Disposition: A | Discharge status: Home / Self Care | Payer: 59 | Source: Ambulatory Visit | Attending: Family Medicine | Admitting: Family Medicine

## 2021-05-13 DIAGNOSIS — Z803 Family history of malignant neoplasm of breast: Secondary | ICD-10-CM

## 2021-05-13 MED ORDER — GADOBUTROL 1 MMOL/ML IV SOLN
6.0000 mL | Freq: Once | INTRAVENOUS | Status: AC | PRN
  Administered 2021-05-13: 6 mL via INTRAVENOUS

## 2021-11-08 ENCOUNTER — Other Ambulatory Visit: Payer: Self-pay | Admitting: Family Medicine

## 2021-11-08 DIAGNOSIS — Z1231 Encounter for screening mammogram for malignant neoplasm of breast: Secondary | ICD-10-CM

## 2022-01-04 ENCOUNTER — Ambulatory Visit
Admission: RE | Admit: 2022-01-04 | Discharge: 2022-01-04 | Disposition: A | Discharge status: Home / Self Care | Payer: 59 | Source: Ambulatory Visit | Attending: Family Medicine | Admitting: Family Medicine

## 2022-01-04 DIAGNOSIS — Z1231 Encounter for screening mammogram for malignant neoplasm of breast: Secondary | ICD-10-CM

## 2022-01-30 ENCOUNTER — Other Ambulatory Visit: Payer: Self-pay | Admitting: Family Medicine

## 2022-01-30 DIAGNOSIS — M8580 Other specified disorders of bone density and structure, unspecified site: Secondary | ICD-10-CM

## 2022-02-23 DIAGNOSIS — E119 Type 2 diabetes mellitus without complications: Secondary | ICD-10-CM | POA: Insufficient documentation

## 2022-04-27 ENCOUNTER — Other Ambulatory Visit: Payer: Self-pay | Admitting: Family Medicine

## 2022-04-27 DIAGNOSIS — R9230 Dense breasts, unspecified: Secondary | ICD-10-CM

## 2022-04-27 DIAGNOSIS — R922 Inconclusive mammogram: Secondary | ICD-10-CM

## 2022-05-19 ENCOUNTER — Encounter: Payer: Self-pay | Admitting: Family Medicine

## 2022-05-24 ENCOUNTER — Ambulatory Visit
Admission: RE | Admit: 2022-05-24 | Discharge: 2022-05-24 | Disposition: A | Discharge status: Home / Self Care | Payer: 59 | Source: Ambulatory Visit | Attending: Family Medicine | Admitting: Family Medicine

## 2022-05-24 DIAGNOSIS — R9230 Dense breasts, unspecified: Secondary | ICD-10-CM

## 2022-05-24 MED ORDER — GADOPICLENOL 0.5 MMOL/ML IV SOLN
6.0000 mL | Freq: Once | INTRAVENOUS | Status: AC | PRN
  Administered 2022-05-24: 6 mL via INTRAVENOUS

## 2022-06-09 ENCOUNTER — Other Ambulatory Visit: Payer: Self-pay | Admitting: Family Medicine

## 2022-06-09 ENCOUNTER — Encounter: Payer: Self-pay | Admitting: Family Medicine

## 2022-06-09 DIAGNOSIS — R911 Solitary pulmonary nodule: Secondary | ICD-10-CM

## 2022-06-13 ENCOUNTER — Ambulatory Visit
Admission: RE | Admit: 2022-06-13 | Discharge: 2022-06-13 | Disposition: A | Discharge status: Home / Self Care | Payer: 59 | Source: Ambulatory Visit | Attending: Family Medicine | Admitting: Family Medicine

## 2022-06-13 DIAGNOSIS — R911 Solitary pulmonary nodule: Secondary | ICD-10-CM

## 2022-06-13 MED ORDER — IOPAMIDOL (ISOVUE-300) INJECTION 61%
75.0000 mL | Freq: Once | INTRAVENOUS | Status: AC | PRN
  Administered 2022-06-13: 75 mL via INTRAVENOUS

## 2022-07-10 ENCOUNTER — Ambulatory Visit
Admission: RE | Admit: 2022-07-10 | Discharge: 2022-07-10 | Disposition: A | Discharge status: Home / Self Care | Payer: 59 | Source: Ambulatory Visit | Attending: Family Medicine | Admitting: Family Medicine

## 2022-07-10 DIAGNOSIS — M8580 Other specified disorders of bone density and structure, unspecified site: Secondary | ICD-10-CM

## 2022-11-22 ENCOUNTER — Other Ambulatory Visit: Payer: Self-pay | Admitting: Family Medicine

## 2022-11-22 DIAGNOSIS — Z1231 Encounter for screening mammogram for malignant neoplasm of breast: Secondary | ICD-10-CM

## 2023-01-08 ENCOUNTER — Ambulatory Visit
Admission: RE | Admit: 2023-01-08 | Discharge: 2023-01-08 | Disposition: A | Discharge status: Home / Self Care | Payer: 59 | Source: Ambulatory Visit | Attending: Family Medicine | Admitting: Family Medicine

## 2023-01-08 DIAGNOSIS — Z1231 Encounter for screening mammogram for malignant neoplasm of breast: Secondary | ICD-10-CM

## 2023-02-02 DIAGNOSIS — Z0000 Encounter for general adult medical examination without abnormal findings: Secondary | ICD-10-CM | POA: Diagnosis not present

## 2023-02-02 DIAGNOSIS — R7301 Impaired fasting glucose: Secondary | ICD-10-CM | POA: Diagnosis not present

## 2023-02-02 DIAGNOSIS — I10 Essential (primary) hypertension: Secondary | ICD-10-CM | POA: Diagnosis not present

## 2023-02-02 DIAGNOSIS — E782 Mixed hyperlipidemia: Secondary | ICD-10-CM | POA: Diagnosis not present

## 2023-02-02 DIAGNOSIS — I2510 Atherosclerotic heart disease of native coronary artery without angina pectoris: Secondary | ICD-10-CM | POA: Diagnosis not present

## 2023-02-15 DIAGNOSIS — H25811 Combined forms of age-related cataract, right eye: Secondary | ICD-10-CM | POA: Diagnosis not present

## 2023-02-15 DIAGNOSIS — H2511 Age-related nuclear cataract, right eye: Secondary | ICD-10-CM | POA: Diagnosis not present

## 2023-02-15 DIAGNOSIS — I2510 Atherosclerotic heart disease of native coronary artery without angina pectoris: Secondary | ICD-10-CM | POA: Diagnosis not present

## 2023-02-27 DIAGNOSIS — R053 Chronic cough: Secondary | ICD-10-CM | POA: Diagnosis not present

## 2023-03-08 DIAGNOSIS — H25812 Combined forms of age-related cataract, left eye: Secondary | ICD-10-CM | POA: Diagnosis not present

## 2023-03-08 DIAGNOSIS — I2510 Atherosclerotic heart disease of native coronary artery without angina pectoris: Secondary | ICD-10-CM | POA: Diagnosis not present

## 2023-03-08 DIAGNOSIS — H2512 Age-related nuclear cataract, left eye: Secondary | ICD-10-CM | POA: Diagnosis not present

## 2023-04-10 DIAGNOSIS — R0981 Nasal congestion: Secondary | ICD-10-CM | POA: Diagnosis not present

## 2023-04-10 DIAGNOSIS — R5383 Other fatigue: Secondary | ICD-10-CM | POA: Diagnosis not present

## 2023-04-30 ENCOUNTER — Ambulatory Visit: Payer: 59 | Attending: Cardiovascular Disease | Admitting: Cardiovascular Disease

## 2023-04-30 VITALS — BP 147/72 | HR 105 | Ht 62.5 in | Wt 108.0 lb

## 2023-04-30 DIAGNOSIS — E7800 Pure hypercholesterolemia, unspecified: Secondary | ICD-10-CM | POA: Diagnosis not present

## 2023-04-30 DIAGNOSIS — I2510 Atherosclerotic heart disease of native coronary artery without angina pectoris: Secondary | ICD-10-CM | POA: Diagnosis not present

## 2023-04-30 DIAGNOSIS — R9431 Abnormal electrocardiogram [ECG] [EKG]: Secondary | ICD-10-CM

## 2023-04-30 DIAGNOSIS — R931 Abnormal findings on diagnostic imaging of heart and coronary circulation: Secondary | ICD-10-CM | POA: Diagnosis not present

## 2023-04-30 DIAGNOSIS — I10 Essential (primary) hypertension: Secondary | ICD-10-CM

## 2023-04-30 DIAGNOSIS — I700 Atherosclerosis of aorta: Secondary | ICD-10-CM

## 2023-04-30 MED ORDER — SIMVASTATIN 20 MG PO TABS: 20.0000 mg | ORAL_TABLET | Freq: Every day | ORAL | 3 refills | Status: DC

## 2023-04-30 NOTE — Progress Notes (Signed)
 Cardiology Office Note:    Date:  04/30/2023   ID:  [name], DOB [birth year], MRN [id number]  PCP:  Glena Landau, MD   [facility] Providers Cardiologist:  None      Referring MD: Glena Landau, MD   Chief Complaint  Patient presents with   Consult  Kristin Fleming is a 73 y.o. female who is being seen today for the evaluation of elevated coronary calcium score and hypercholesterolemia at the request of Glena Landau, MD.  History of Present Illness:    Kristin Fleming is a 73 y.o. female with a hx of HTN, dyslipidemia and prediabetes and a family history of early onset CAD, referred to discuss lipid-lowering therapy after coronary calcium score 05/29/2022 was 531.  The patient specifically denies any chest pain at rest or with exertion, dyspnea at rest or with exertion, orthopnea, paroxysmal nocturnal dyspnea, syncope, palpitations, focal neurological deficits, intermittent claudication, lower extremity edema, unexplained weight gain, cough, hemoptysis or wheezing.  Her lipid profile from 01/26/2022 showed cholesterol 178, triglycerides 85, HDL 58 and LDL 105 (on treatment with fenofibrate 160 mg daily and simvastatin  10 mg daily).  Dr. Marcheta Seta notes state that her untreated LDL cholesterol is over 200.  In 2015 her echocardiogram showed normal LV function with EF 60 to 65% and no evidence of mitral valve prolapse (previously mentioned on her chart).  Her coronary calcium score in May 2024 was 531.  Parents had coronary disease.  Her father died at age 61.  Her mother died of breast cancer at age 37.  She quit smoking 40 years ago with a 10 pack year history of smoking.  Blood pressure is usually well-controlled.  A little high today.  She thinks is because she is still recovering from an episode of acute bronchitis, following influenza couple of weeks earlier.  When she last saw Dr. Bernetta Brilliant her blood pressure was excellent.  Past Medical History:  Diagnosis Date    Allergic rhinitis    Allergy     Arthritis    Dysmetabolic syndrome X    Hypercholesteremia    Hyperglycemia    Hyperlipidemia    Knee MCL sprain    Knee pain    right   Medial meniscus tear    Left   Mitral valve prolapse    Pes planus    Unequal leg length (acquired)     Past Surgical History:  Procedure Laterality Date   BREAST BIOPSY Left 1983   Left ( benign)   BREAST EXCISIONAL BIOPSY Left    1979 or 1980   NASAL SEPTUM SURGERY  1980s   SINOSCOPY     TOE DEBRIDEMENT Left 07/23/13    Current Medications: Current Meds  Medication Sig   benzonatate (TESSALON) 200 MG capsule Take 200 mg by mouth 3 (three) times daily as needed for cough.   chlorthalidone (HYGROTON) 25 MG tablet Take 25 mg by mouth daily.   Cholecalciferol (VITAMIN D PO) Take by mouth daily.   dicyclomine (BENTYL) 10 MG capsule Take 10 mg by mouth 4 (four) times daily -  before meals and at bedtime.   doxycycline (VIBRA-TABS) 100 MG tablet Take 100 mg by mouth 2 (two) times daily.   finasteride (PROPECIA) 1 MG tablet Take 1 mg by mouth daily.   fluticasone (FLONASE) 50 MCG/ACT nasal spray Place 1 spray into both nostrils daily.   HYDROcodone bit-homatropine (HYCODAN) 5-1.5 MG/5ML syrup Take 5 mLs by mouth every 6 (six) hours as needed.  ipratropium (ATROVENT ) 0.06 % nasal spray INSTILL 2 SPRAYS PER NOSTRIL 2 OR 3 TIMES DAILY AS NEEDED   Lactobacillus (ACIDOPHILUS/PECTIN PO) Take by mouth.   magnesium gluconate (MAGONATE) 500 MG tablet Take 500 mg by mouth 2 (two) times daily.   metFORMIN (GLUCOPHAGE-XR) 500 MG 24 hr tablet Take 500 mg by mouth daily with breakfast.   POTASSIUM AMINOBENZOATE PO Take by mouth.   Probiotic Product (PROBIOTIC PO) Take by mouth.   [place] POWD by Does not apply route daily.   [DISCONTINUED] fenofibrate 160 MG tablet Take 160 mg by mouth daily.   [DISCONTINUED] simvastatin  (ZOCOR ) 10 MG tablet Take 10 mg by mouth daily at 6 PM.     Allergies:   Codeine,  Niacin, Prednisone, and Sulfa antibiotics   Social History   Socioeconomic History   Marital status: Married    Spouse name: Not on file   Number of children: N   Years of education: Not on file   Highest education level: Not on file  Occupational History   Occupation: journalist/writer  Tobacco Use   Smoking status: Former    Current packs/day: 0.00    Average packs/day: 0.3 packs/day for 14.0 years (4.2 ttl pk-yrs)    Types: Cigarettes    Start date: 01/09/1970    Quit date: 01/10/1984    Years since quitting: 39.3   Smokeless tobacco: Never   Tobacco comments:    started at age 45.   Vaping Use   Vaping status: Never Used  Substance and Sexual Activity   Alcohol use: Yes    Alcohol/week: 5.0 standard drinks of alcohol    Types: 5 Glasses of wine per week   Drug use: No   Sexual activity: Yes    Partners: Male    Birth control/protection: None  Other Topics Concern   Not on file  Social History Narrative   Not on file   Social Drivers of Health   Financial Resource Strain: Not on file  Food Insecurity: Not on file  Transportation Needs: Not on file  Physical Activity: Not on file  Stress: Not on file  Social Connections: Unknown (05/23/2022)   Received from [facility]   Social Network    Social Network: Not on file     Family History: The patient's family history includes Alcohol abuse in her brother; Allergic rhinitis in an other family member; Anxiety disorder in her brother; Asthma in an other family member; Bipolar disorder in her brother; Breast cancer (age of onset: 18) in her maternal grandmother; Breast cancer (age of onset: 15) in her mother; Breast cancer (age of onset: 27) in her sister; COPD in her mother; Diabetes in her father; Heart attack in her father; Heart disease in her father; Hypertension in her mother; Rheum arthritis in her sister. There is no history of Colon cancer, Angioedema, Eczema, Immunodeficiency, or Urticaria.  ROS:    Please see the history of present illness.     All other systems reviewed and are negative.  EKGs/Labs/Other Studies Reviewed:    The following studies were reviewed today:  EKG Interpretation Date/Time:  Monday April 30 2023 08:36:58 EDT Ventricular Rate:  105 PR Interval:  122 QRS Duration:  86 QT Interval:  346 QTC Calculation: 457 R Axis:   135  Text Interpretation: Sinus tachycardia Possible Left atrial enlargement Right axis deviation Pulmonary disease pattern Right ventricular hypertrophy No previous ECGs available Confirmed by Lasheika Ortloff ([id number]) on 04/30/2023 9:08:15 AM  Recent Labs: No results found for requested labs within last 365 days.  Recent Lipid Panel No results found for: "CHOL", "TRIG", "HDL", "CHOLHDL", "VLDL", "LDLCALC", "LDLDIRECT"  03/16/2008 cholesterol 253 triglycerides 84 HDL 60 LDL 176 04/08/2012 cholesterol 222 triglycerides 85 HDL 58 LDL 147  LDL particle #2271 small LDL particle #1212 02/02/2023 cholesterol 152, HDL 58, LDL 80, triglycerides 70, hemoglobin A1c 5.9%  Risk Assessment/Calculations:      HYPERTENSION CONTROL Vitals:   04/30/23 0843 04/30/23 0908  BP: (!) 148/74 (!) 147/72    The patient's blood pressure is elevated above target today.  In order to address the patient's elevated BP: Blood pressure will be monitored at home to determine if medication changes need to be made.            Physical Exam:    VS:  BP (!) 147/72   Pulse (!) 105   Ht 5' 2.5" (1.588 m)   Wt 108 lb (49 kg)   SpO2 94%   BMI 19.44 kg/m     Wt Readings from Last 3 Encounters:  04/30/23 108 lb (49 kg)  04/24/17 112 lb 9.6 oz (51.1 kg)  07/25/13 113 lb (51.3 kg)     GEN:  Well nourished, well developed in no acute distress.  Appears lean and fit HEENT: Normal NECK: No JVD; No carotid bruits LYMPHATICS: No lymphadenopathy CARDIAC: RRR, no murmurs, rubs, gallops RESPIRATORY:  Clear to auscultation without rales, wheezing or rhonchi   ABDOMEN: Soft, non-tender, non-distended MUSCULOSKELETAL:  No edema; No deformity  SKIN: Warm and dry NEUROLOGIC:  Alert and oriented x 3 PSYCHIATRIC:  Normal affect   ASSESSMENT:    1. Coronary artery disease involving native coronary artery of native heart without angina pectoris   2. Elevated coronary artery calcium score   3. Atherosclerosis of aorta (HCC)   4. Hypercholesterolemia   5. Nonspecific abnormal electrocardiogram (ECG) (EKG)   6. Essential hypertension    PLAN:    In order of problems listed above:  CAD: Asymptomatic.  The calcium score is severely elevated and in range implying the same risk as established CAD.  Target LDL less than 70.  Plain treadmill stress test when she gets over the current episode of acute bronchitis. Aortic atherosclerosis: Normal caliber aorta on the CT from 2024 HLP: Labs from 2014 showed very severely elevated LDL particle number.  Currently on a very low-dose of simvastatin  as well as fenofibrate.  She has never had elevated triglycerides so we will stop the fenofibrate to reduce the risk of muscle side effects.  Increase simvastatin  20 mg daily and repeat a lipid profile in 3 months. Abnormal ECG: Suggests possible chronic lung disease, even though she quit smoking 40 years ago.  PFTs in 2019 did show mild airway obstruction with an FEV1 71% of predicted.  No bronchodilator response.  CT 2024 showed minimal biapical pleural and parenchymal scarring currently with an episode of acute bronchitis, coughing a lot, recovering from suspected flu.  It may be worthwhile repeating those lung tests. Elevated BP: Probably situational.  Also mildly tachycardic.  At her recent visit with Dr. Mason Sole her blood pressure was substantially lower at 118/74.      Informed Consent   Shared Decision Making/Informed Consent The risks [chest pain, shortness of breath, cardiac arrhythmias, dizziness, blood pressure fluctuations, myocardial infarction,  stroke/transient ischemic attack, and life-threatening complications (estimated to be 1 in 10,000)], benefits (risk stratification, diagnosing coronary artery disease, treatment guidance) and alternatives of an exercise tolerance  test were discussed in detail with Ms. Canedo and she agrees to proceed.       Medication Adjustments/Labs and Tests Ordered: Current medicines are reviewed at length with the patient today.  Concerns regarding medicines are outlined above.  Orders Placed This Encounter  Procedures   Lipid panel   EXERCISE TOLERANCE TEST (ETT)   EKG 12-Lead   Meds ordered this encounter  Medications   simvastatin  (ZOCOR ) 20 MG tablet    Sig: Take 1 tablet (20 mg total) by mouth daily at 6 PM.    Dispense:  90 tablet    Refill:  3    Patient Instructions  Medication Instructions:  Stop Fenofibrate Increase Simvastatin  to 20 mg every evening *If you need a refill on your cardiac medications before your next appointment, please call your pharmacy*  Lab Work: Lipid panel- Fasting in 3 months If you have labs (blood work) drawn today and your tests are completely normal, you will receive your results only by: MyChart Message (if you have MyChart) OR A paper copy in the mail If you have any lab test that is abnormal or we need to change your treatment, we will call you to review the results.  Testing/Procedures:                       Patient Instructions for Exercise Treadmill Test  Medication instructions:   Do NOT take your __________________N/A______________  Do not eat, drink or use tobacco products four hours prior to the test.  Water is ok.  3.  Dress prepared to exercise in a comfortable, two piece clothing outfit and walking shoes.  4.  Bring any current prescription medications with you the day of the test.  5.  Notify the office 24 hours in advance if you cannot keep this appointment.  6.  If you have any questions, please call [phone].    Follow-Up: At [facility], you and your health needs are our priority.  As part of our continuing mission to provide you with exceptional heart care, our providers are all part of one team.  This team includes your primary Cardiologist (physician) and Advanced Practice Providers or APPs (Physician Assistants and Nurse Practitioners) who all work together to provide you with the care you need, when you need it.  Your next appointment:   1 year(s)  Provider:   Dr Alvis Ba  We recommend signing up for the patient portal called "MyChart".  Sign up information is provided on this After Visit Summary.  MyChart is used to connect with patients for Virtual Visits (Telemedicine).  Patients are able to view lab/test results, encounter notes, upcoming appointments, etc.  Non-urgent messages can be sent to your provider as well.   To learn more about what you can do with MyChart, go to [email or web address].        1st Floor: - Lobby - Registration  - Pharmacy  - Lab - Cafe  2nd Floor: - PV Lab - Diagnostic Testing (echo, CT, nuclear med)  3rd Floor: - Vacant  4th Floor: - TCTS (cardiothoracic surgery) - AFib Clinic - Structural Heart Clinic - Vascular Surgery  - Vascular Ultrasound  5th Floor: - HeartCare Cardiology (general and EP) - Clinical Pharmacy for coumadin, hypertension, lipid, weight-loss medications, and med management appointments    Valet parking services will be available as well.      Signed, Luana Rumple, MD  04/30/2023 10:09 AM    [facility]

## 2023-04-30 NOTE — Patient Instructions (Addendum)
 Medication Instructions:  Stop Fenofibrate Increase Simvastatin  to 20 mg every evening *If you need a refill on your cardiac medications before your next appointment, please call your pharmacy*  Lab Work: Lipid panel- Fasting in 3 months If you have labs (blood work) drawn today and your tests are completely normal, you will receive your results only by: MyChart Message (if you have MyChart) OR A paper copy in the mail If you have any lab test that is abnormal or we need to change your treatment, we will call you to review the results.  Testing/Procedures:                       Patient Instructions for Exercise Treadmill Test  Medication instructions:   Do NOT take your __________________N/A______________  Do not eat, drink or use tobacco products four hours prior to the test.  Water is ok.  3.  Dress prepared to exercise in a comfortable, two piece clothing outfit and walking shoes.  4.  Bring any current prescription medications with you the day of the test.  5.  Notify the office 24 hours in advance if you cannot keep this appointment.  6.  If you have any questions, please call [phone].   Follow-Up: At [facility], you and your health needs are our priority.  As part of our continuing mission to provide you with exceptional heart care, our providers are all part of one team.  This team includes your primary Cardiologist (physician) and Advanced Practice Providers or APPs (Physician Assistants and Nurse Practitioners) who all work together to provide you with the care you need, when you need it.  Your next appointment:   1 year(s)  Provider:   Dr Alvis Ba  We recommend signing up for the patient portal called "MyChart".  Sign up information is provided on this After Visit Summary.  MyChart is used to connect with patients for Virtual Visits (Telemedicine).  Patients are able to view lab/test results, encounter notes, upcoming appointments, etc.  Non-urgent  messages can be sent to your provider as well.   To learn more about what you can do with MyChart, go to [email or web address].        1st Floor: - Lobby - Registration  - Pharmacy  - Lab - Cafe  2nd Floor: - PV Lab - Diagnostic Testing (echo, CT, nuclear med)  3rd Floor: - Vacant  4th Floor: - TCTS (cardiothoracic surgery) - AFib Clinic - Structural Heart Clinic - Vascular Surgery  - Vascular Ultrasound  5th Floor: - HeartCare Cardiology (general and EP) - Clinical Pharmacy for coumadin, hypertension, lipid, weight-loss medications, and med management appointments    Valet parking services will be available as well.

## 2023-05-02 DIAGNOSIS — J40 Bronchitis, not specified as acute or chronic: Secondary | ICD-10-CM | POA: Diagnosis not present

## 2023-05-07 DIAGNOSIS — L814 Other melanin hyperpigmentation: Secondary | ICD-10-CM | POA: Diagnosis not present

## 2023-05-07 DIAGNOSIS — Z411 Encounter for cosmetic surgery: Secondary | ICD-10-CM | POA: Diagnosis not present

## 2023-05-07 DIAGNOSIS — L578 Other skin changes due to chronic exposure to nonionizing radiation: Secondary | ICD-10-CM | POA: Diagnosis not present

## 2023-05-07 DIAGNOSIS — L821 Other seborrheic keratosis: Secondary | ICD-10-CM | POA: Diagnosis not present

## 2023-05-18 ENCOUNTER — Telehealth: Payer: Self-pay | Admitting: Cardiovascular Disease

## 2023-05-18 NOTE — Telephone Encounter (Signed)
 [facility] called in stating pt told them she is supposed to have labs drawn and she would like to have them done there. Please advise if lipid panel 3 months out can be faxed to number [phone]

## 2023-05-18 NOTE — Telephone Encounter (Signed)
Returned call to patient left message on voice  mail to call back.

## 2023-05-23 DIAGNOSIS — H04562 Stenosis of left lacrimal punctum: Secondary | ICD-10-CM | POA: Diagnosis not present

## 2023-05-23 DIAGNOSIS — H04561 Stenosis of right lacrimal punctum: Secondary | ICD-10-CM | POA: Diagnosis not present

## 2023-05-23 DIAGNOSIS — H04222 Epiphora due to insufficient drainage, left lacrimal gland: Secondary | ICD-10-CM | POA: Diagnosis not present

## 2023-05-23 DIAGNOSIS — D23112 Other benign neoplasm of skin of right lower eyelid, including canthus: Secondary | ICD-10-CM | POA: Diagnosis not present

## 2023-05-23 DIAGNOSIS — H04221 Epiphora due to insufficient drainage, right lacrimal gland: Secondary | ICD-10-CM | POA: Diagnosis not present

## 2023-05-23 NOTE — Telephone Encounter (Signed)
 Faxed lab slip for Lipid panel to be drawn approximately 07/30/23 to PCP office

## 2023-06-11 DIAGNOSIS — E782 Mixed hyperlipidemia: Secondary | ICD-10-CM | POA: Diagnosis not present

## 2023-06-11 DIAGNOSIS — D649 Anemia, unspecified: Secondary | ICD-10-CM | POA: Diagnosis not present

## 2023-06-11 DIAGNOSIS — D473 Essential (hemorrhagic) thrombocythemia: Secondary | ICD-10-CM | POA: Diagnosis not present

## 2023-06-13 ENCOUNTER — Ambulatory Visit: Payer: Self-pay | Admitting: Cardiovascular Disease

## 2023-06-13 ENCOUNTER — Telehealth: Payer: Self-pay | Admitting: Emergency Medicine

## 2023-06-13 ENCOUNTER — Other Ambulatory Visit: Payer: Self-pay | Admitting: Family Medicine

## 2023-06-13 DIAGNOSIS — R922 Inconclusive mammogram: Secondary | ICD-10-CM

## 2023-06-13 DIAGNOSIS — E7800 Pure hypercholesterolemia, unspecified: Secondary | ICD-10-CM

## 2023-06-13 MED ORDER — ROSUVASTATIN CALCIUM 20 MG PO TABS: 20.0000 mg | ORAL_TABLET | Freq: Every day | ORAL | 3 refills | Status: DC

## 2023-06-13 NOTE — Telephone Encounter (Signed)
 LDL cholesterol remains a little higher than desirable at 105.  Ideally, we would want the LDL less than 70 , or at least a 50% reduction from baseline (I think the baseline was 176 when untreated). The triglycerides are barely above normal range, so the fenofibrate (which we stopped) is not necessary. Rather than increasing the dose of simvastatin  (which tends to have more likely side effects at higher doses), I would recommend switching to a more potent statin. Please switch to rosuvastatin 20 mg daily and recheck the lipid profile in 3-6 months.   RX sent to [facility] on [address] in [place], lipid panel ordered- will send a reminder to patient when repeat labs are needed.

## 2023-06-21 ENCOUNTER — Encounter: Payer: Self-pay | Admitting: Pediatrics

## 2023-07-04 DIAGNOSIS — J219 Acute bronchiolitis, unspecified: Secondary | ICD-10-CM | POA: Diagnosis not present

## 2023-07-06 DIAGNOSIS — J219 Acute bronchiolitis, unspecified: Secondary | ICD-10-CM | POA: Diagnosis not present

## 2023-07-06 DIAGNOSIS — R053 Chronic cough: Secondary | ICD-10-CM | POA: Diagnosis not present

## 2023-07-09 ENCOUNTER — Ambulatory Visit
Admission: RE | Admit: 2023-07-09 | Discharge: 2023-07-09 | Disposition: A | Discharge status: Home / Self Care | Source: Ambulatory Visit | Attending: Family Medicine | Admitting: Family Medicine

## 2023-07-09 DIAGNOSIS — Z1239 Encounter for other screening for malignant neoplasm of breast: Secondary | ICD-10-CM | POA: Diagnosis not present

## 2023-07-09 DIAGNOSIS — R922 Inconclusive mammogram: Secondary | ICD-10-CM

## 2023-07-09 DIAGNOSIS — Z803 Family history of malignant neoplasm of breast: Secondary | ICD-10-CM | POA: Diagnosis not present

## 2023-07-09 MED ORDER — GADOPICLENOL 0.5 MMOL/ML IV SOLN
5.0000 mL | Freq: Once | INTRAVENOUS | Status: AC | PRN
  Administered 2023-07-09: 5 mL via INTRAVENOUS

## 2023-07-23 ENCOUNTER — Encounter: Payer: Self-pay | Admitting: Cardiovascular Disease

## 2023-07-23 DIAGNOSIS — E7800 Pure hypercholesterolemia, unspecified: Secondary | ICD-10-CM

## 2023-07-23 NOTE — Telephone Encounter (Signed)
 Sorry she is having these troubles. Let's try going back to the simvastatin  at a higher dose 40 mg daily, but to get to LDL under 70, we will also need to add ezetimibe  10 mg daily. Plan a lipid profile 2-3 months after starting this new regimen.

## 2023-07-25 MED ORDER — EZETIMIBE 10 MG PO TABS: 10.0000 mg | ORAL_TABLET | Freq: Every day | ORAL | 3 refills | Status: DC

## 2023-07-25 MED ORDER — SIMVASTATIN 40 MG PO TABS: 40.0000 mg | ORAL_TABLET | Freq: Every day | ORAL | 3 refills | Status: DC

## 2023-07-26 ENCOUNTER — Ambulatory Visit (AMBULATORY_SURGERY_CENTER)

## 2023-07-26 ENCOUNTER — Encounter: Payer: Self-pay | Admitting: Pharmacist

## 2023-07-26 VITALS — Ht 62.5 in | Wt 108.0 lb

## 2023-07-26 DIAGNOSIS — I2510 Atherosclerotic heart disease of native coronary artery without angina pectoris: Secondary | ICD-10-CM | POA: Insufficient documentation

## 2023-07-26 DIAGNOSIS — E2839 Other primary ovarian failure: Secondary | ICD-10-CM | POA: Insufficient documentation

## 2023-07-26 DIAGNOSIS — K5730 Diverticulosis of large intestine without perforation or abscess without bleeding: Secondary | ICD-10-CM | POA: Insufficient documentation

## 2023-07-26 DIAGNOSIS — D509 Iron deficiency anemia, unspecified: Secondary | ICD-10-CM | POA: Insufficient documentation

## 2023-07-26 DIAGNOSIS — R7301 Impaired fasting glucose: Secondary | ICD-10-CM | POA: Insufficient documentation

## 2023-07-26 DIAGNOSIS — E559 Vitamin D deficiency, unspecified: Secondary | ICD-10-CM | POA: Insufficient documentation

## 2023-07-26 DIAGNOSIS — M8580 Other specified disorders of bone density and structure, unspecified site: Secondary | ICD-10-CM | POA: Insufficient documentation

## 2023-07-26 DIAGNOSIS — Z1211 Encounter for screening for malignant neoplasm of colon: Secondary | ICD-10-CM

## 2023-07-26 DIAGNOSIS — I10 Essential (primary) hypertension: Secondary | ICD-10-CM | POA: Insufficient documentation

## 2023-07-26 DIAGNOSIS — Z8249 Family history of ischemic heart disease and other diseases of the circulatory system: Secondary | ICD-10-CM | POA: Insufficient documentation

## 2023-07-26 DIAGNOSIS — R911 Solitary pulmonary nodule: Secondary | ICD-10-CM | POA: Insufficient documentation

## 2023-07-26 DIAGNOSIS — K589 Irritable bowel syndrome without diarrhea: Secondary | ICD-10-CM | POA: Insufficient documentation

## 2023-07-26 DIAGNOSIS — L659 Nonscarring hair loss, unspecified: Secondary | ICD-10-CM | POA: Insufficient documentation

## 2023-07-26 MED ORDER — NA SULFATE-K SULFATE-MG SULF 17.5-3.13-1.6 GM/177ML PO SOLN: 1.0000 | Freq: Once | ORAL | 0 refills | Status: AC

## 2023-07-26 NOTE — Progress Notes (Signed)

## 2023-07-30 LAB — LIPID PANEL
Chol/HDL Ratio: 3.6 ratio (ref 0.0–4.4)
Cholesterol, Total: 208 mg/dL — ABNORMAL HIGH (ref 100–199)
HDL: 57 mg/dL (ref >39)
LDL Chol Calc (NIH): 129 mg/dL — ABNORMAL HIGH (ref 0–99)
Triglycerides: 126 mg/dL (ref 0–149)
VLDL Cholesterol Cal: 22 mg/dL (ref 5–40)

## 2023-08-09 NOTE — Progress Notes (Unsigned)
 [facility] History and Physical   Primary Care Physician:  Loreli Kins, MD   Reason for Procedure:  Colorectal cancer screening  Plan:    Screening colonoscopy     HPI: Kristin Fleming is a 73 y.o. female undergoing screening colonoscopy for colorectal cancer screening.  Patient's last colonoscopy was performed in 2015 demonstrating diverticulosis throughout the entire colon but no polyps.  No family history of colorectal cancer or polyps.  Patient denies current symptoms of change in bowel habits or rectal bleeding.   Past Medical History:  Diagnosis Date   Allergic rhinitis    Allergy     Arthritis    Dysmetabolic syndrome X    Hypercholesteremia    Hyperglycemia    Hyperlipidemia    Knee MCL sprain    Knee pain    right   Medial meniscus tear    Left   Mitral valve prolapse    Pes planus    Unequal leg length (acquired)     Past Surgical History:  Procedure Laterality Date   BREAST BIOPSY Left 1983   Left ( benign)   BREAST EXCISIONAL BIOPSY Left    1979 or 1980   NASAL SEPTUM SURGERY  1980s   SINOSCOPY     TOE DEBRIDEMENT Left 07/23/13    Prior to Admission medications   Medication Sig Start Date End Date Taking? Authorizing Provider  aspirin 81 MG tablet Take 81 mg by mouth daily.    [provider]  benzonatate (TESSALON) 200 MG capsule Take 200 mg by mouth 3 (three) times daily as needed for cough. Patient not taking: Reported on 07/26/2023 04/26/23   [provider]  chlorthalidone (HYGROTON) 25 MG tablet Take 25 mg by mouth daily.    [provider]  Cholecalciferol (VITAMIN D PO) Take by mouth daily.    [provider]  cholecalciferol (VITAMIN D3) 25 MCG (1000 UNIT) tablet Take 1,000 Units by mouth daily.    [provider]  Coenzyme Q10 200 MG capsule Take 200 mg by mouth daily.    [provider]  dicyclomine (BENTYL) 10 MG capsule Take 10 mg by mouth 4 (four) times daily -  before  meals and at bedtime.    [provider]  diphenhydrAMINE (BENADRYL ALLERGY ) 25 MG tablet Take 25 mg by mouth at bedtime as needed.    [provider]  doxycycline (VIBRA-TABS) 100 MG tablet Take 100 mg by mouth 2 (two) times daily. Patient not taking: Reported on 07/26/2023 04/02/23   [provider]  ezetimibe  (ZETIA ) 10 MG tablet Take 1 tablet (10 mg total) by mouth daily. Patient not taking: Reported on 07/26/2023 07/25/23   Croitoru, Mihai, MD  finasteride (PROPECIA) 1 MG tablet Take 1 mg by mouth daily.    [provider]  fluticasone (FLONASE) 50 MCG/ACT nasal spray Place 1 spray into both nostrils daily. Patient not taking: Reported on 07/26/2023 04/10/23   [provider]  HYDROcodone bit-homatropine (HYCODAN) 5-1.5 MG/5ML syrup Take 5 mLs by mouth every 6 (six) hours as needed. Patient not taking: Reported on 07/26/2023 04/17/23   [provider]  ipratropium (ATROVENT ) 0.06 % nasal spray INSTILL 2 SPRAYS PER NOSTRIL 2 OR 3 TIMES DAILY AS NEEDED Patient not taking: Reported on 07/26/2023 06/12/17   Bobbitt, Elgin Pepper, MD  ketotifen (ZADITOR) 0.025 % ophthalmic solution 1 drop 2 (two) times daily.    [provider]  Lactobacillus (ACIDOPHILUS/PECTIN PO) Take by mouth. Patient not taking: Reported on  07/26/2023    [provider]  magnesium gluconate (MAGONATE) 500 MG tablet Take 500 mg by mouth 2 (two) times daily.    [provider]  metFORMIN (GLUCOPHAGE-XR) 500 MG 24 hr tablet Take 500 mg by mouth daily with breakfast. 12/11/22   [provider]  minoxidil (LONITEN) 2.5 MG tablet Take 2.5 mg by mouth daily.    [provider]  Multiple Vitamin (MULTI VITAMIN) TABS Take 1 tablet by mouth daily.    [provider]  POTASSIUM AMINOBENZOATE PO Take by mouth.    [provider]  Probiotic Product (PROBIOTIC PO) Take by mouth. Patient not taking: Reported on 07/26/2023    [provider]  simvastatin  (ZOCOR ) 20 MG tablet Take 20 mg by mouth daily. 06/07/22   [provider]  simvastatin  (ZOCOR ) 40 MG tablet Take 1 tablet (40 mg total) by mouth at bedtime. Patient not taking: Reported on 07/26/2023 07/25/23   Croitoru, Mihai, MD  tretinoin (RETIN-A) 0.1 % cream Apply 1 Application topically every other day. 05/08/23   [provider]  [facility] POWD by Does not apply route daily.    [provider]    Current Outpatient Medications  Medication Sig Dispense Refill   chlorthalidone (HYGROTON) 25 MG tablet Take 25 mg by mouth daily.     Cholecalciferol (VITAMIN D PO) Take by mouth daily.     cholecalciferol (VITAMIN D3) 25 MCG (1000 UNIT) tablet Take 1,000 Units by mouth daily.     Coenzyme Q10 200 MG capsule Take 200 mg by mouth daily.     dicyclomine (BENTYL) 10 MG capsule Take 10 mg by mouth 4 (four) times daily -  before meals and at bedtime.     finasteride (PROPECIA) 1 MG tablet Take 1 mg by mouth daily.     Lactobacillus (ACIDOPHILUS/PECTIN PO) Take by mouth.     metFORMIN (GLUCOPHAGE-XR) 500 MG 24 hr tablet Take 500 mg by mouth daily with breakfast.     minoxidil (LONITEN) 2.5 MG tablet Take 2.5 mg by mouth daily.     POTASSIUM AMINOBENZOATE PO Take by mouth.     simvastatin  (ZOCOR ) 20 MG tablet Take 20 mg by mouth daily.     tretinoin (RETIN-A) 0.1 % cream Apply 1 Application topically every other day.     [facility] POWD by Does not apply route daily.     aspirin 81 MG tablet Take 81 mg by mouth daily.     fluticasone (FLONASE) 50 MCG/ACT nasal spray Place 1 spray into both nostrils daily. (Patient not taking: No sig reported)     ketotifen (ZADITOR) 0.025 % ophthalmic solution 1 drop 2 (two) times daily.     magnesium gluconate (MAGONATE) 500 MG tablet Take 500 mg by mouth 2 (two) times daily.     Multiple Vitamin (MULTI VITAMIN) TABS Take 1 tablet by mouth daily.     Probiotic Product (PROBIOTIC PO) Take by  mouth. (Patient not taking: No sig reported)     Current Facility-Administered Medications  Medication Dose Route Frequency Provider Last Rate Last Admin   0.9 %  sodium chloride  infusion  500 mL Intravenous Once Daryan Buell M, MD        Allergies as of 08/10/2023 - Review Complete 08/10/2023  Allergen Reaction Noted   Atorvastatin Other (See Comments) 08/10/2023   Codeine Nausea And Vomiting    Colesevelam Other (See Comments) 08/10/2023   Iron Other (See Comments) 08/10/2023   Levofloxacin  Other (See Comments) 08/10/2023   Niacin Hives    Prednisone Other (See Comments)    Rosuvastatin  Other (See Comments) 08/10/2023   Hydrochlorothiazide Other (See Comments) 08/10/2023   Sulfa antibiotics Other (See Comments) 04/30/2023    Family History  Problem Relation Age of Onset   Hypertension Mother    Breast cancer Mother 71   COPD Mother    Heart disease Father    Heart attack Father    Diabetes Father    Rheum arthritis Sister    Breast cancer Sister 65   Bipolar disorder Brother    Alcohol abuse Brother    Anxiety disorder Brother    Breast cancer Maternal Grandmother 52   Allergic rhinitis Other    Asthma Other    Colon cancer Neg Hx    Angioedema Neg Hx    Eczema Neg Hx    Immunodeficiency Neg Hx    Urticaria Neg Hx    Rectal cancer Neg Hx    Stomach cancer Neg Hx    Esophageal cancer Neg Hx     Social History   Socioeconomic History   Marital status: Married    Spouse name: Not on file   Number of children: N   Years of education: Not on file   Highest education level: Not on file  Occupational History   Occupation: journalist/writer  Tobacco Use   Smoking status: Former    Current packs/day: 0.00    Average packs/day: 0.3 packs/day for 14.0 years (4.2 ttl pk-yrs)    Types: Cigarettes    Start date: 01/09/1970    Quit date: 01/10/1984    Years since quitting: 39.6   Smokeless tobacco: Never   Tobacco comments:    started at age 72.   Vaping Use    Vaping status: Never Used  Substance and Sexual Activity   Alcohol use: Yes    Alcohol/week: 5.0 standard drinks of alcohol    Types: 5 Glasses of wine per week   Drug use: No   Sexual activity: Yes    Partners: Male    Birth control/protection: None  Other Topics Concern   Not on file  Social History Narrative   Not on file   Social Drivers of Health   Financial Resource Strain: Not on file  Food Insecurity: Not on file  Transportation Needs: Not on file  Physical Activity: Not on file  Stress: Not on file  Social Connections: Unknown (05/23/2022)   Received [facility]   Social Network    Social Network: Not on file  Intimate Partner Violence: Unknown (05/23/2022)   Received from [facility]   HITS    Physically Hurt: Not on file    Insult or Talk Down To: Not on file    Threaten Physical Harm: Not on file    Scream or Curse: Not on file    Review of Systems:  All other review of systems negative except as mentioned in the HPI.  Physical Exam: Vital signs BP (!) 183/107   Pulse 89   Temp 97.7 F (36.5 C)   Resp 13   Ht 5' 2.5 (1.588 m)   Wt 108 lb (49 kg)   SpO2 98%   BMI 19.44 kg/m   General:   Alert,  Well-developed, well-nourished, pleasant and cooperative in NAD Airway:  Mallampati 1 Lungs:  Clear throughout to auscultation.   Heart:  Regular rate and rhythm; no murmurs, clicks, rubs,  or gallops. Abdomen:  Soft, nontender and  nondistended. Normal bowel sounds.   Neuro/Psych:  Normal mood and affect. A and O x 3  Inocente Hausen, MD [facility]

## 2023-08-10 ENCOUNTER — Encounter: Payer: Self-pay | Admitting: Pediatrics

## 2023-08-10 ENCOUNTER — Ambulatory Visit: Admitting: Pediatrics

## 2023-08-10 VITALS — BP 142/78 | HR 70 | Temp 97.7°F | Resp 10 | Ht 62.5 in | Wt 108.0 lb

## 2023-08-10 DIAGNOSIS — Z1211 Encounter for screening for malignant neoplasm of colon: Secondary | ICD-10-CM | POA: Diagnosis not present

## 2023-08-10 DIAGNOSIS — K644 Residual hemorrhoidal skin tags: Secondary | ICD-10-CM | POA: Diagnosis not present

## 2023-08-10 DIAGNOSIS — K5730 Diverticulosis of large intestine without perforation or abscess without bleeding: Secondary | ICD-10-CM

## 2023-08-10 DIAGNOSIS — D128 Benign neoplasm of rectum: Secondary | ICD-10-CM | POA: Diagnosis not present

## 2023-08-10 DIAGNOSIS — K648 Other hemorrhoids: Secondary | ICD-10-CM

## 2023-08-10 MED ORDER — SODIUM CHLORIDE 0.9 % IV SOLN: 500.0000 mL | Freq: Once | INTRAVENOUS | Status: AC

## 2023-08-10 NOTE — Op Note (Signed)
 [facility] Patient Name: Kristin Fleming Procedure Date: 08/10/2023 7:04 AM MRN: [id number] Endoscopist: Inocente Hausen , MD, [id number] Age: 73 Referring MD:  Date of Birth: April 14, 1950 Gender: Female Account #: [id number] Procedure:                Colonoscopy Indications:              Screening for colorectal malignant neoplasm, Last                            colonoscopy: 2015 Medicines:                Monitored Anesthesia Care Procedure:                Pre-Anesthesia Assessment:                           - Prior to the procedure, a History and Physical                            was performed, and patient medications and                            allergies were reviewed. The patient's tolerance of                            previous anesthesia was also reviewed. The risks                            and benefits of the procedure and the sedation                            options and risks were discussed with the patient.                            All questions were answered, and informed consent                            was obtained. Prior Anticoagulants: The patient has                            taken no anticoagulant or antiplatelet agents. ASA                            Grade Assessment: II - A patient with mild systemic                            disease. After reviewing the risks and benefits,                            the patient was deemed in satisfactory condition to                            undergo the procedure.  After obtaining informed consent, the pediatric                            colonoscope was passed under direct vision.                            Throughout the procedure, the patient's blood                            pressure, pulse, and oxygen saturations were                            monitored continuously. The Olympus Scope                            [id number] was introduced through the anus and                             advanced to the cecum, identified by appendiceal                            orifice and ileocecal valve. The colonoscopy was                            somewhat difficult due to multiple diverticula in                            the colon and restricted mobility of the colon.                            Successful completion of the procedure was aided by                            water insufflation and careful maneuvering in the                            sigmoid colon. The patient tolerated the procedure                            well. The quality of the bowel preparation was                            good. The ileocecal valve, appendiceal orifice, and                            rectum were photographed. Scope In: 8:01:22 AM Scope Out: 8:18:29 AM Scope Withdrawal Time: 0 hours 10 minutes 36 seconds  Total Procedure Duration: 0 hours 17 minutes 7 seconds  Findings:                 Skin tags were found on perianal exam.                           The digital rectal exam was normal. Pertinent  negatives include normal sphincter tone and no                            palpable rectal lesions.                           Multiple small-mouthed diverticula were found in                            the sigmoid colon, descending colon, transverse                            colon and ascending colon.                           A 10 mm polyp was found in the rectum. The polyp                            was pedunculated. The polyp was removed with a hot                            snare. Resection and retrieval were complete.                           Internal hemorrhoids were found during retroflexion. Complications:            No immediate complications. Estimated blood loss:                            Minimal. Estimated Blood Loss:     Estimated blood loss was minimal. Impression:               - Perianal skin tags found on perianal exam.                           -  Diverticulosis in the sigmoid colon, in the                            descending colon, in the transverse colon and in                            the ascending colon.                           - One 10 mm polyp in the rectum, removed with a hot                            snare. Resected and retrieved.                           - Internal hemorrhoids. Recommendation:           - Discharge patient to home (ambulatory).                           - Await pathology results.                           -  Repeat colonoscopy for surveillance based on                            pathology results.                           - The findings and recommendations were discussed                            with the patient's family.                           - Return to referring physician.                           - Patient has a contact number available for                            emergencies. The signs and symptoms of potential                            delayed complications were discussed with the                            patient. Return to normal activities tomorrow.                            Written discharge instructions were provided to the                            patient. Inocente Hausen, MD 08/10/2023 8:29:10 AM This report has been signed electronically.

## 2023-08-10 NOTE — Patient Instructions (Signed)
 YOU HAD AN ENDOSCOPIC PROCEDURE TODAY AT THE [facility]:   Refer to the procedure report that was given to you for any specific questions about what was found during the examination.  If the procedure report does not answer your questions, please call your gastroenterologist to clarify.  If you requested that your care partner not be given the details of your procedure findings, then the procedure report has been included in a sealed envelope for you to review at your convenience later.  YOU SHOULD EXPECT: Some feelings of bloating in the abdomen. Passage of more gas than usual.  Walking can help get rid of the air that was put into your GI tract during the procedure and reduce the bloating. If you had a lower endoscopy (such as a colonoscopy or flexible sigmoidoscopy) you may notice spotting of blood in your stool or on the toilet paper. If you underwent a bowel prep for your procedure, you may not have a normal bowel movement for a few days.  Please Note:  You might notice some irritation and congestion in your nose or some drainage.  This is from the oxygen used during your procedure.  There is no need for concern and it should clear up in a day or so.  SYMPTOMS TO REPORT IMMEDIATELY:  Following lower endoscopy (colonoscopy or flexible sigmoidoscopy):  Excessive amounts of blood in the stool  Significant tenderness or worsening of abdominal pains  Swelling of the abdomen that is new, acute  Fever of 100F or higher  Resume previous diet Await pathology results Handouts on polyps and hemorrhoids given   For urgent or emergent issues, a gastroenterologist can be reached at any hour by calling [phone]. Do not use MyChart messaging for urgent concerns.    DIET:  We do recommend a small meal at first, but then you may proceed to your regular diet.  Drink plenty of fluids but you should avoid alcoholic beverages for 24 hours.  ACTIVITY:  You should plan to take it easy for  the rest of today and you should NOT DRIVE or use heavy machinery until tomorrow (because of the sedation medicines used during the test).    FOLLOW UP: Our staff will call the number listed on your records the next business day following your procedure.  We will call around 7:15- 8:00 am to check on you and address any questions or concerns that you may have regarding the information given to you following your procedure. If we do not reach you, we will leave a message.     If any biopsies were taken you will be contacted by phone or by letter within the next 1-3 weeks.  Please call us  at [phone] if you have not heard about the biopsies in 3 weeks.    SIGNATURES/CONFIDENTIALITY: You and/or your care partner have signed paperwork which will be entered into your electronic medical record.  These signatures attest to the fact that that the information above on your After Visit Summary has been reviewed and is understood.  Full responsibility of the confidentiality of this discharge information lies with you and/or your care-partner.

## 2023-08-10 NOTE — Progress Notes (Signed)
 Pt's states no medical or surgical changes since previsit or office visit.

## 2023-08-10 NOTE — Progress Notes (Signed)
 Called to room to assist during endoscopic procedure.  Patient ID and intended procedure confirmed with present staff. Received instructions for my participation in the procedure from the performing physician.

## 2023-08-10 NOTE — Progress Notes (Signed)
 Vss nad trans to pacu

## 2023-08-13 ENCOUNTER — Telehealth: Payer: Self-pay

## 2023-08-13 NOTE — Telephone Encounter (Signed)
 Follow up call to pt, lm for pt to call if having any difficulty with normal activities or eating and drinking.  Also to call if any other questions or concerns.

## 2023-08-14 ENCOUNTER — Ambulatory Visit: Payer: Self-pay | Admitting: Pediatrics

## 2023-08-14 LAB — SURGICAL PATHOLOGY

## 2023-10-10 ENCOUNTER — Other Ambulatory Visit: Payer: Self-pay | Admitting: Emergency Medicine

## 2023-10-10 ENCOUNTER — Encounter: Payer: Self-pay | Admitting: Emergency Medicine

## 2023-10-10 DIAGNOSIS — E7800 Pure hypercholesterolemia, unspecified: Secondary | ICD-10-CM

## 2023-10-10 NOTE — Progress Notes (Unsigned)
 Fasting lipid panel needed between now and December.

## 2023-11-01 ENCOUNTER — Ambulatory Visit: Payer: Self-pay

## 2023-11-01 NOTE — Telephone Encounter (Signed)
 Patient states that she meant to call her pulmonologist at [facility] [place] to follow up with Dr Alaine.  Patient states that she just looked up a pulmonary number and didn't realize she had called [facility].   She then looked up the contact number again and found [phone] and this RN advised her that is the number I see in the chart for that location in her previous visits with them. She states that she is going to call that number for her pulmonologist's office.  She denied being in any distress at this time and states that she was just trying to follow up with them. She is advised that if she needs any further assistance she can call us  back and she verbalized understanding.               Copied from CRM #[id number]. Topic: Clinical - Red Word Triage >> Nov 01, 2023 12:49 PM Lavanda D wrote: Red Word that prompted transfer to Nurse Triage: Patient is having excessive mucus and was told if she experiences this that Dr. Alaine would like to do a culture. Patient said there was a discoloration of the mucus - she said it was a brownish color but the majority is clear. Very heavy mucus production. Reason for Disposition  [1] Caller requesting NON-URGENT health information AND [2] PCP's office is the best resource  Answer Assessment - Initial Assessment Questions Patient states that she meant to call her pulmonologist at [facility] [place] to follow up with Dr Alaine.  Patient states that she just looked up a pulmonary number and didn't realize she had called [facility].   She then looked up the contact number again and found [phone] and this RN advised her that is the number I see in the chart for that location in her previous visits with them. She states that she is going to call that number for her pulmonologist's office.  She denied being in any distress at this time and  states that she was just trying to follow up with them. She is advised that if she needs any further assistance she can call us  back and she verbalized understanding.  Protocols used: Information Only Call - No Triage-A-AH

## 2023-11-29 ENCOUNTER — Other Ambulatory Visit: Payer: Self-pay | Admitting: Family Medicine

## 2023-11-29 DIAGNOSIS — Z1231 Encounter for screening mammogram for malignant neoplasm of breast: Secondary | ICD-10-CM

## 2024-01-11 ENCOUNTER — Ambulatory Visit
Admission: RE | Admit: 2024-01-11 | Discharge: 2024-01-11 | Disposition: A | Discharge status: Home / Self Care | Source: Ambulatory Visit | Attending: Family Medicine | Admitting: Family Medicine

## 2024-01-11 DIAGNOSIS — Z1231 Encounter for screening mammogram for malignant neoplasm of breast: Secondary | ICD-10-CM

## 2024-02-06 LAB — LAB REPORT - SCANNED
A1c: 6.1
Creatinine, POC: 73 mg/dL
EGFR: 88
